# Patient Record
Sex: Male | Born: 1990 | Hispanic: No | Marital: Single | Smoking: Never smoker
Health system: Southern US, Community
[De-identification: ages and names within clinical notes are randomized; demographics above are authoritative.]

## PROBLEM LIST (undated history)

## (undated) ENCOUNTER — Ambulatory Visit: Admission: EM | Payer: Self-pay | Source: Home / Self Care

## (undated) HISTORY — PX: FOOT SURGERY: SHX648

---
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
## 2015-12-29 DIAGNOSIS — S62509A Fracture of unspecified phalanx of unspecified thumb, initial encounter for closed fracture: Secondary | ICD-10-CM | POA: Diagnosis not present

## 2015-12-29 DIAGNOSIS — M25531 Pain in right wrist: Secondary | ICD-10-CM | POA: Diagnosis not present

## 2015-12-29 DIAGNOSIS — M79641 Pain in right hand: Secondary | ICD-10-CM | POA: Diagnosis not present

## 2016-01-10 DIAGNOSIS — M79641 Pain in right hand: Secondary | ICD-10-CM | POA: Diagnosis not present

## 2016-02-13 DIAGNOSIS — M79641 Pain in right hand: Secondary | ICD-10-CM | POA: Diagnosis not present

## 2016-02-13 DIAGNOSIS — M79644 Pain in right finger(s): Secondary | ICD-10-CM | POA: Diagnosis not present

## 2016-08-09 DIAGNOSIS — J309 Allergic rhinitis, unspecified: Secondary | ICD-10-CM | POA: Diagnosis not present

## 2016-08-09 DIAGNOSIS — L739 Follicular disorder, unspecified: Secondary | ICD-10-CM | POA: Diagnosis not present

## 2017-03-29 DIAGNOSIS — S86011A Strain of right Achilles tendon, initial encounter: Secondary | ICD-10-CM | POA: Diagnosis not present

## 2017-04-02 DIAGNOSIS — S86011D Strain of right Achilles tendon, subsequent encounter: Secondary | ICD-10-CM | POA: Diagnosis not present

## 2017-04-03 DIAGNOSIS — S86011A Strain of right Achilles tendon, initial encounter: Secondary | ICD-10-CM | POA: Diagnosis not present

## 2017-04-03 DIAGNOSIS — G8918 Other acute postprocedural pain: Secondary | ICD-10-CM | POA: Diagnosis not present

## 2017-04-19 DIAGNOSIS — Z9889 Other specified postprocedural states: Secondary | ICD-10-CM | POA: Diagnosis not present

## 2017-05-02 DIAGNOSIS — Z9889 Other specified postprocedural states: Secondary | ICD-10-CM | POA: Diagnosis not present

## 2017-05-23 DIAGNOSIS — Z9889 Other specified postprocedural states: Secondary | ICD-10-CM | POA: Diagnosis not present

## 2017-06-03 DIAGNOSIS — S86011D Strain of right Achilles tendon, subsequent encounter: Secondary | ICD-10-CM | POA: Diagnosis not present

## 2017-06-05 ENCOUNTER — Emergency Department (HOSPITAL_BASED_OUTPATIENT_CLINIC_OR_DEPARTMENT_OTHER)
Admission: EM | Admit: 2017-06-05 | Discharge: 2017-06-05 | Disposition: A | Discharge status: Home / Self Care | Payer: Federal, State, Local not specified - PPO | Attending: Emergency Medicine | Admitting: Emergency Medicine

## 2017-06-05 ENCOUNTER — Encounter (HOSPITAL_BASED_OUTPATIENT_CLINIC_OR_DEPARTMENT_OTHER): Payer: Self-pay | Admitting: Emergency Medicine

## 2017-06-05 ENCOUNTER — Other Ambulatory Visit: Payer: Self-pay

## 2017-06-05 DIAGNOSIS — R002 Palpitations: Secondary | ICD-10-CM | POA: Diagnosis not present

## 2017-06-05 LAB — I-STAT CHEM 8, ED
BUN: 10 mg/dL (ref 6–20)
Calcium, Ion: 1.11 mmol/L — ABNORMAL LOW (ref 1.15–1.40)
Chloride: 102 mmol/L (ref 101–111)
Creatinine, Ser: 1 mg/dL (ref 0.61–1.24)
Glucose, Bld: 99 mg/dL (ref 65–99)
HCT: 45 % (ref 39.0–52.0)
Hemoglobin: 15.3 g/dL (ref 13.0–17.0)
Potassium: 4.4 mmol/L (ref 3.5–5.1)
Sodium: 140 mmol/L (ref 135–145)
TCO2: 27 mmol/L (ref 22–32)

## 2017-06-05 NOTE — ED Triage Notes (Addendum)
States had a episode of his heart beating fast last night and again this am, denies at present. Admits to drinking ETOH last night and smoke weed last night

## 2017-06-05 NOTE — ED Provider Notes (Signed)
[facility] EMERGENCY DEPARTMENT Provider Note  CSN: [id number] Arrival date & time: 06/05/17 4098  Chief Complaint(s) Palpitations  HPI Kirk Fitzgerald is a 27 y.o. male   The history is provided by the patient.  Palpitations   This is a recurrent problem. Episode onset: several yrs. this morning was 2 hrs ago. The problem occurs rarely. The problem has been resolved. On average, each episode lasts 5 minutes. Pertinent negatives include no fever, no malaise/fatigue, no chest pain, no exertional chest pressure, no irregular heartbeat, no PND, no abdominal pain, no nausea, no dizziness, no hemoptysis and no shortness of breath. Risk factors include obesity (EtOH use and marijuana use).   States that he noted HR of 198 with portable pulse ox at home. Resolved spontaneously within 5 minutes to HR in 80s.  Past Medical History History reviewed. No pertinent past medical history. There are no active problems to display for this patient.  Home Medication(s) Prior to Admission medications   Not on File                                                                                                                                    Past Surgical History Past Surgical History:  Procedure Laterality Date  . FOOT SURGERY Right    Family History No family history on file.  Social History Social History   Tobacco Use  . Smoking status: Not on file  Substance Use Topics  . Alcohol use: Not on file  . Drug use: Not on file   Allergies Pollen extract  Review of Systems Review of Systems  Constitutional: Negative for fever and malaise/fatigue.  Respiratory: Negative for hemoptysis and shortness of breath.   Cardiovascular: Positive for palpitations. Negative for chest pain and PND.  Gastrointestinal: Negative for abdominal pain and nausea.  Neurological: Negative for dizziness.   All other systems are reviewed and are negative for acute change except as noted in the  HPI  Physical Exam Vital Signs  I have reviewed the triage vital signs BP 117/81   Pulse 86   Temp 97.9 F (36.6 C) (Oral)   Resp 14   Ht 6\' 3"  (1.905 m)   Wt 117.9 kg (260 lb)   SpO2 99%   BMI 32.50 kg/m   Physical Exam  Constitutional: He is oriented to person, place, and time. He appears well-developed and well-nourished. No distress.  HENT:  Head: Normocephalic and atraumatic.  Nose: Nose normal.  Eyes: Conjunctivae and EOM are normal. Pupils are equal, round, and reactive to light. Right eye exhibits no discharge. Left eye exhibits no discharge. No scleral icterus.  Neck: Normal range of motion. Neck supple.  Cardiovascular: Normal rate and regular rhythm. Exam reveals no gallop and no friction rub.  No murmur heard. Pulmonary/Chest: Effort normal and breath sounds normal. No stridor. No respiratory distress. He has no rales.  Abdominal: Soft. He exhibits no distension.  There is no tenderness.  Musculoskeletal: He exhibits no edema or tenderness.  Neurological: He is alert and oriented to person, place, and time.  Skin: Skin is warm and dry. No rash noted. He is not diaphoretic. No erythema.  Psychiatric: He has a normal mood and affect.  Vitals reviewed.   ED Results and Treatments Labs (all labs ordered are listed, but only abnormal results are displayed) Labs Reviewed  I-STAT CHEM 8, ED - Abnormal; Notable for the following components:      Result Value   Calcium, Ion 1.11 (*)    All other components within normal limits                                                                                                                         EKG  EKG Interpretation  Date/Time:  Wednesday June 05 2017 07:48:52 EST Ventricular Rate:  87 PR Interval:    QRS Duration: 98 QT Interval:  371 QTC Calculation: 447 R Axis:   38 Text Interpretation:  Sinus rhythm No significant change since last tracing Confirmed by Drema Pry [phone]) on 06/05/2017 8:31:35 AM       Radiology No results found. Pertinent labs & imaging results that were available during my care of the patient were reviewed by me and considered in my medical decision making (see chart for details).  Medications Ordered in ED Medications - No data to display                                                                                                                                  Procedures Procedures  (including critical care time)  Medical Decision Making / ED Course I have reviewed the nursing notes for this encounter and the patient's prior records (if available in EHR or on provided paperwork).    Recurrent short episodes of palpitations.  Pulse ox at rate of 190s that resolved spontaneously.  EKG without acute dysrhythmias, blocks, interval changes.  No evidence of Brugada or Wolff-Parkinson-White.  Electrolytes grossly reassuring.  The patient appears reasonably screened and/or stabilized for discharge and I doubt any other medical condition or other [facility] requiring further screening, evaluation, or treatment in the ED at this time prior to discharge.  The patient is safe for discharge with strict return precautions.   Final Clinical Impression(s) / ED Diagnoses Final diagnoses:  Palpitations    Disposition: Discharge  Condition:  Good  I have discussed the results, Dx and Tx plan with the patient who expressed understanding and agree(s) with the plan. Discharge instructions discussed at great length. The patient was given strict return precautions who verbalized understanding of the instructions. No further questions at time of discharge.    ED Discharge Orders    None       Follow Up: Farris HasMorrow, Aaron, MD [address] [place] [id number] [phone]  Schedule an appointment as soon as possible for a visit  As needed     This chart was dictated using voice recognition software.  Despite best efforts to proofread,  errors can  occur which can change the documentation meaning.   Nira Connardama, Pedro Eduardo, MD 06/05/17 [phone]

## 2017-06-07 DIAGNOSIS — S86011D Strain of right Achilles tendon, subsequent encounter: Secondary | ICD-10-CM | POA: Diagnosis not present

## 2017-06-14 DIAGNOSIS — S86011D Strain of right Achilles tendon, subsequent encounter: Secondary | ICD-10-CM | POA: Diagnosis not present

## 2017-06-21 DIAGNOSIS — S86011D Strain of right Achilles tendon, subsequent encounter: Secondary | ICD-10-CM | POA: Diagnosis not present

## 2017-06-26 DIAGNOSIS — S86011D Strain of right Achilles tendon, subsequent encounter: Secondary | ICD-10-CM | POA: Diagnosis not present

## 2017-06-28 DIAGNOSIS — S86011D Strain of right Achilles tendon, subsequent encounter: Secondary | ICD-10-CM | POA: Diagnosis not present

## 2017-07-10 DIAGNOSIS — S86011D Strain of right Achilles tendon, subsequent encounter: Secondary | ICD-10-CM | POA: Diagnosis not present

## 2019-03-25 ENCOUNTER — Other Ambulatory Visit: Payer: Self-pay

## 2019-03-25 DIAGNOSIS — Z20822 Contact with and (suspected) exposure to covid-19: Secondary | ICD-10-CM

## 2019-03-27 LAB — NOVEL CORONAVIRUS, NAA: SARS-CoV-2, NAA: NOT DETECTED

## 2019-05-18 ENCOUNTER — Ambulatory Visit: Payer: Self-pay | Attending: Internal Medicine

## 2019-05-18 ENCOUNTER — Other Ambulatory Visit: Payer: Self-pay

## 2019-05-18 DIAGNOSIS — Z20822 Contact with and (suspected) exposure to covid-19: Secondary | ICD-10-CM | POA: Insufficient documentation

## 2019-05-19 LAB — NOVEL CORONAVIRUS, NAA: SARS-CoV-2, NAA: NOT DETECTED

## 2019-12-19 ENCOUNTER — Ambulatory Visit
Admission: RE | Admit: 2019-12-19 | Discharge: 2019-12-19 | Disposition: A | Discharge status: Home / Self Care | Payer: Self-pay | Source: Ambulatory Visit | Attending: Emergency Medicine | Admitting: Emergency Medicine

## 2019-12-19 ENCOUNTER — Other Ambulatory Visit: Payer: Self-pay

## 2019-12-19 VITALS — BP 132/91 | HR 72 | Temp 98.1°F | Resp 18

## 2019-12-19 DIAGNOSIS — Z20822 Contact with and (suspected) exposure to covid-19: Secondary | ICD-10-CM

## 2019-12-19 DIAGNOSIS — K529 Noninfective gastroenteritis and colitis, unspecified: Secondary | ICD-10-CM

## 2019-12-19 DIAGNOSIS — Z1152 Encounter for screening for COVID-19: Secondary | ICD-10-CM

## 2019-12-19 NOTE — ED Provider Notes (Signed)
[facility]    CSN: [id number] Arrival date & time: 12/19/19  1156      History   Chief Complaint Chief Complaint  Patient presents with  . Appointment    1200  . Emesis  . Diarrhea    HPI Kirk Fitzgerald is a 29 y.o. male presenting for Covid testing.  Endorsing nausea, vomiting, diarrhea for the last 5 days.  States frequency of diarrhea has abated the last few days.  No blood, melena, abdominal pain.  Denies nausea currently.  Denies URI symptoms such as nasal congestion, cough.  No known sick contacts.  States that he missed work the other day due to symptoms so they recommended Covid testing.    History reviewed. No pertinent past medical history.  There are no problems to display for this patient.   Past Surgical History:  Procedure Laterality Date  . FOOT SURGERY Right        Home Medications    Prior to Admission medications   Not on File    Family History History reviewed. No pertinent family history.  Social History Social History   Tobacco Use  . Smoking status: Not on file  Substance Use Topics  . Alcohol use: Not on file  . Drug use: Not on file     Allergies   Pollen extract   Review of Systems As per HPI   Physical Exam Triage Vital Signs ED Triage Vitals [12/19/19 1212]  Enc Vitals Group     BP (!) 132/91     Pulse Rate 72     Resp 18     Temp 98.1 F (36.7 C)     Temp Source Oral     SpO2 98 %     Weight      Height      Head Circumference      Peak Flow      Pain Score      Pain Loc      Pain Edu?      Excl. in GC?    No data found.  Updated Vital Signs BP (!) 132/91 (BP Location: Right Arm)   Pulse 72   Temp 98.1 F (36.7 C) (Oral)   Resp 18   SpO2 98%   Visual Acuity Right Eye Distance:   Left Eye Distance:   Bilateral Distance:    Right Eye Near:   Left Eye Near:    Bilateral Near:     Physical Exam Constitutional:      General: He is not in acute distress.    Appearance: He is  not toxic-appearing or diaphoretic.  HENT:     Head: Normocephalic and atraumatic.     Mouth/Throat:     Mouth: Mucous membranes are moist.     Pharynx: Oropharynx is clear.  Eyes:     General: No scleral icterus.    Conjunctiva/sclera: Conjunctivae normal.     Pupils: Pupils are equal, round, and reactive to light.  Neck:     Comments: Trachea midline, negative JVD Cardiovascular:     Rate and Rhythm: Normal rate and regular rhythm.  Pulmonary:     Effort: Pulmonary effort is normal. No respiratory distress.     Breath sounds: No wheezing.  Abdominal:     Palpations: Abdomen is soft.     Tenderness: There is no abdominal tenderness.  Musculoskeletal:     Cervical back: Neck supple. No tenderness.  Lymphadenopathy:     Cervical: No cervical adenopathy.  Skin:    Capillary Refill: Capillary refill takes less than 2 seconds.     Coloration: Skin is not jaundiced or pale.     Findings: No rash.  Neurological:     Mental Status: He is alert and oriented to person, place, and time.      UC Treatments / Results  Labs (all labs ordered are listed, but only abnormal results are displayed) Labs Reviewed  NOVEL CORONAVIRUS, NAA    EKG   Radiology No results found.  Procedures Procedures (including critical care time)  Medications Ordered in UC Medications - No data to display  Initial Impression / Assessment and Plan / UC Course  I have reviewed the triage vital signs and the nursing notes.  Pertinent labs & imaging results that were available during my care of the patient were reviewed by me and considered in my medical decision making (see chart for details).     Patient afebrile, nontoxic, with SpO2 98%.  Covid PCR pending.  Patient to quarantine until results are back.  We will treat supportively as outlined below.  Return precautions discussed, patient verbalized understanding and is agreeable to plan. Final Clinical Impressions(s) / UC Diagnoses   Final  diagnoses:  Encounter for screening for COVID-19  Exposure to COVID-19 virus  Gastroenteritis     Discharge Instructions     Your COVID test is pending - it is important to quarantine / isolate at home until your results are back. If you test positive and would like further evaluation for persistent or worsening symptoms, you may schedule an E-visit or virtual (video) visit throughout the [facility] app or website.  PLEASE NOTE: If you develop severe chest pain or shortness of breath please go to the ER or call 9-1-1 for further evaluation --> DO NOT schedule electronic or virtual visits for this. Please call our office for further guidance / recommendations as needed.  For information about the Covid vaccine, please visit [email or web address]    ED Prescriptions    None     PDMP not reviewed this encounter.   Hall-Potvin, [place], [place] 12/19/19 1257

## 2019-12-19 NOTE — Discharge Instructions (Addendum)
Your COVID test is pending - it is important to quarantine / isolate at home until your results are back. °If you test positive and would like further evaluation for persistent or worsening symptoms, you may schedule an E-visit or virtual (video) visit throughout the [facility] MyChart app or website. ° °PLEASE NOTE: If you develop severe chest pain or shortness of breath please go to the ER or call 9-1-1 for further evaluation --> DO NOT schedule electronic or virtual visits for this. °Please call our office for further guidance / recommendations as needed. ° °For information about the Covid vaccine, please visit [email or web address] °

## 2019-12-19 NOTE — ED Triage Notes (Signed)
Pt here for intermittent N/V/D x 5 days; pt sts was possibly exposed to covid earlier this week

## 2019-12-20 LAB — NOVEL CORONAVIRUS, NAA: SARS-CoV-2, NAA: NOT DETECTED

## 2019-12-20 LAB — SARS-COV-2, NAA 2 DAY TAT

## 2020-01-27 ENCOUNTER — Ambulatory Visit
Admission: RE | Admit: 2020-01-27 | Discharge: 2020-01-27 | Disposition: A | Discharge status: Home / Self Care | Payer: Self-pay | Source: Ambulatory Visit | Attending: Emergency Medicine | Admitting: Emergency Medicine

## 2020-01-27 ENCOUNTER — Other Ambulatory Visit: Payer: Self-pay

## 2020-01-27 VITALS — BP 138/82 | HR 63 | Temp 97.9°F | Resp 18

## 2020-01-27 DIAGNOSIS — Z1152 Encounter for screening for COVID-19: Secondary | ICD-10-CM

## 2020-01-27 DIAGNOSIS — J069 Acute upper respiratory infection, unspecified: Secondary | ICD-10-CM

## 2020-01-27 DIAGNOSIS — R197 Diarrhea, unspecified: Secondary | ICD-10-CM

## 2020-01-27 MED ORDER — BENZONATATE 200 MG PO CAPS: 200.0000 mg | ORAL_CAPSULE | Freq: Three times a day (TID) | ORAL | 0 refills | Status: AC | PRN

## 2020-01-27 NOTE — ED Provider Notes (Signed)
[facility]    CSN: [id number] Arrival date & time: 01/27/20  1507      History   Chief Complaint Chief Complaint  Patient presents with  . Appointment    1500  . Cough  . Diarrhea    HPI Kirk Fitzgerald is a 29 y.o. male presenting today for evaluation of cough and diarrhea.  Patient reports over the past 3 days has had cough and diarrhea.  Has had some mild associate abdominal cramping with diarrhea.  Diarrhea has eased off today.  Denies associated sore throat.  Has had congestion, but attributes this to his regular allergies.  Denies fevers.  Denies known Covid exposure.  HPI  History reviewed. No pertinent past medical history.  There are no problems to display for this patient.   Past Surgical History:  Procedure Laterality Date  . FOOT SURGERY Right        Home Medications    Prior to Admission medications   Medication Sig Start Date End Date Taking? Authorizing Provider  benzonatate (TESSALON) 200 MG capsule Take 1 capsule (200 mg total) by mouth 3 (three) times daily as needed for up to 7 days for cough. 01/27/20 02/03/20  Saahir Prude, Junius Creamer, PA-C    Family History Family History  Family history unknown: Yes    Social History Social History   Tobacco Use  . Smoking status: Never Smoker  . Smokeless tobacco: Never Used  Substance Use Topics  . Alcohol use: Not on file  . Drug use: Not on file     Allergies   Pollen extract   Review of Systems Review of Systems  Constitutional: Negative for activity change, appetite change, chills, fatigue and fever.  HENT: Positive for congestion and rhinorrhea. Negative for ear pain, sinus pressure, sore throat and trouble swallowing.   Eyes: Negative for discharge and redness.  Respiratory: Positive for cough. Negative for chest tightness and shortness of breath.   Cardiovascular: Negative for chest pain.  Gastrointestinal: Positive for abdominal pain and diarrhea. Negative for nausea and  vomiting.  Musculoskeletal: Negative for myalgias.  Skin: Negative for rash.  Neurological: Negative for dizziness, light-headedness and headaches.     Physical Exam Triage Vital Signs ED Triage Vitals  Enc Vitals Group     BP 01/27/20 1543 138/82     Pulse Rate 01/27/20 1543 63     Resp 01/27/20 1543 18     Temp 01/27/20 1543 97.9 F (36.6 C)     Temp Source 01/27/20 1543 Oral     SpO2 01/27/20 1543 98 %     Weight --      Height --      Head Circumference --      Peak Flow --      Pain Score 01/27/20 1544 4     Pain Loc --      Pain Edu? --      Excl. in GC? --    No data found.  Updated Vital Signs BP 138/82 (BP Location: Right Arm)   Pulse 63   Temp 97.9 F (36.6 C) (Oral)   Resp 18   SpO2 98%   Visual Acuity Right Eye Distance:   Left Eye Distance:   Bilateral Distance:    Right Eye Near:   Left Eye Near:    Bilateral Near:     Physical Exam Vitals and nursing note reviewed.  Constitutional:      Appearance: He is well-developed.     Comments:  No acute distress  HENT:     Head: Normocephalic and atraumatic.     Ears:     Comments: Bilateral ears without tenderness to palpation of external auricle, tragus and mastoid, EAC's without erythema or swelling, TM's with good bony landmarks and cone of light. Non erythematous.     Nose: Nose normal.     Mouth/Throat:     Comments: Oral mucosa pink and moist, no tonsillar enlargement or exudate. Posterior pharynx patent and nonerythematous, no uvula deviation or swelling. Normal phonation. Eyes:     Conjunctiva/sclera: Conjunctivae normal.  Cardiovascular:     Rate and Rhythm: Normal rate.  Pulmonary:     Effort: Pulmonary effort is normal. No respiratory distress.     Comments: Breathing comfortably at rest, CTABL, no wheezing, rales or other adventitious sounds auscultated Abdominal:     General: There is no distension.  Musculoskeletal:        General: Normal range of motion.     Cervical back: Neck  supple.  Skin:    General: Skin is warm and dry.  Neurological:     Mental Status: He is alert and oriented to person, place, and time.      UC Treatments / Results  Labs (all labs ordered are listed, but only abnormal results are displayed) Labs Reviewed  NOVEL CORONAVIRUS, NAA    EKG   Radiology No results found.  Procedures Procedures (including critical care time)  Medications Ordered in UC Medications - No data to display  Initial Impression / Assessment and Plan / UC Course  I have reviewed the triage vital signs and the nursing notes.  Pertinent labs & imaging results that were available during my care of the patient were reviewed by me and considered in my medical decision making (see chart for details).     Covid test pending.  3 days of URI symptoms and diarrhea, suspect likely viral etiology.  Exam reassuring.  Recommending symptomatic and supportive care with close monitoring.  Discussed strict return precautions. Patient verbalized understanding and is agreeable with plan.  Final Clinical Impressions(s) / UC Diagnoses   Final diagnoses:  Encounter for screening for COVID-19  Viral URI with cough  Diarrhea, unspecified type     Discharge Instructions     COVID test pending Rest and fluids Tylenol and ibuprofen as needed  Tessalon for cough Follow up if not improving or worsneing    ED Prescriptions    Medication Sig Dispense Auth. Provider   benzonatate (TESSALON) 200 MG capsule Take 1 capsule (200 mg total) by mouth 3 (three) times daily as needed for up to 7 days for cough. 28 capsule Donyale Falcon, [place] C, PA-C     PDMP not reviewed this encounter.   Lew Dawes, PA-C 01/27/20 1710

## 2020-01-27 NOTE — ED Triage Notes (Signed)
Pt here for cough and diarrhea starting on Monday

## 2020-01-27 NOTE — Discharge Instructions (Signed)
COVID test pending Rest and fluids Tylenol and ibuprofen as needed  Tessalon for cough Follow up if not improving or worsneing

## 2020-01-28 LAB — NOVEL CORONAVIRUS, NAA: SARS-CoV-2, NAA: NOT DETECTED

## 2020-01-28 LAB — SARS-COV-2, NAA 2 DAY TAT

## 2020-03-01 ENCOUNTER — Other Ambulatory Visit: Payer: Self-pay

## 2020-03-01 ENCOUNTER — Ambulatory Visit
Admission: RE | Admit: 2020-03-01 | Discharge: 2020-03-01 | Disposition: A | Discharge status: Home / Self Care | Payer: Self-pay | Source: Ambulatory Visit | Attending: Family Medicine | Admitting: Family Medicine

## 2020-03-01 VITALS — BP 123/81 | HR 65 | Temp 98.1°F | Resp 18

## 2020-03-01 DIAGNOSIS — J069 Acute upper respiratory infection, unspecified: Secondary | ICD-10-CM

## 2020-03-01 DIAGNOSIS — Z1152 Encounter for screening for COVID-19: Secondary | ICD-10-CM

## 2020-03-01 MED ORDER — BENZONATATE 100 MG PO CAPS: 100.0000 mg | ORAL_CAPSULE | Freq: Three times a day (TID) | ORAL | 0 refills | Status: DC

## 2020-03-01 NOTE — ED Provider Notes (Signed)
[facility]    CSN: [id number] Arrival date & time: 03/01/20  1251      History   Chief Complaint Chief Complaint  Patient presents with  . Sore Throat  . Shortness of Breath  . Cough    HPI Kirk Fitzgerald is a 29 y.o. male.  Patient presents with nonproductive cough and some sore throat.  No fever.  Denies any sinus congestion postnasal drainage.  There is some soreness laterally in his chest probably related to the coughing.  HPI  History reviewed. No pertinent past medical history.  There are no problems to display for this patient.   Past Surgical History:  Procedure Laterality Date  . FOOT SURGERY Right        Home Medications    Prior to Admission medications   Not on File    Family History Family History  Family history unknown: Yes    Social History Social History   Tobacco Use  . Smoking status: Never Smoker  . Smokeless tobacco: Never Used  Substance Use Topics  . Alcohol use: Not on file  . Drug use: Not on file     Allergies   Pollen extract   Review of Systems Review of Systems  Constitutional: Negative for fever.  HENT: Positive for congestion.   Respiratory: Positive for cough.   Cardiovascular: Positive for chest pain.  All other systems reviewed and are negative.    Physical Exam Triage Vital Signs ED Triage Vitals  Enc Vitals Group     BP 03/01/20 1305 123/81     Pulse Rate 03/01/20 1305 65     Resp 03/01/20 1305 18     Temp 03/01/20 1305 98.1 F (36.7 C)     Temp Source 03/01/20 1305 Oral     SpO2 03/01/20 1305 97 %     Weight --      Height --      Head Circumference --      Peak Flow --      Pain Score 03/01/20 1306 4     Pain Loc --      Pain Edu? --      Excl. in GC? --    No data found.  Updated Vital Signs BP 123/81 (BP Location: Left Arm)   Pulse 65   Temp 98.1 F (36.7 C) (Oral)   Resp 18   SpO2 97%   Visual Acuity Right Eye Distance:   Left Eye Distance:   Bilateral  Distance:    Right Eye Near:   Left Eye Near:    Bilateral Near:     Physical Exam Vitals and nursing note reviewed.  Constitutional:      Appearance: He is well-developed.  HENT:     Right Ear: Tympanic membrane normal.     Left Ear: Tympanic membrane normal.     Mouth/Throat:     Mouth: Mucous membranes are moist.  Cardiovascular:     Rate and Rhythm: Normal rate and regular rhythm.  Pulmonary:     Effort: Pulmonary effort is normal. No respiratory distress.     Breath sounds: Normal breath sounds. No wheezing, rhonchi or rales.  Chest:     Chest wall: No tenderness.  Neurological:     General: No focal deficit present.     Mental Status: He is alert and oriented to person, place, and time.      UC Treatments / Results  Labs (all labs ordered are listed, but only abnormal  results are displayed) Labs Reviewed  NOVEL CORONAVIRUS, NAA    EKG   Radiology No results found.  Procedures Procedures (including critical care time)  Medications Ordered in UC Medications - No data to display  Initial Impression / Assessment and Plan / UC Course  I have reviewed the triage vital signs and the nursing notes.  Pertinent labs & imaging results that were available during my care of the patient were reviewed by me and considered in my medical decision making (see chart for details).     Upper respiratory infection with cough Final Clinical Impressions(s) / UC Diagnoses   Final diagnoses:  Encounter for screening for COVID-19   Discharge Instructions   None    ED Prescriptions    None     PDMP not reviewed this encounter.   Frederica Kuster, MD 03/01/20 1344

## 2020-03-01 NOTE — ED Triage Notes (Signed)
Pt present sore throat with coughing and sob. Symptoms started two or three days ago.

## 2020-03-02 LAB — SARS-COV-2, NAA 2 DAY TAT

## 2020-03-02 LAB — NOVEL CORONAVIRUS, NAA: SARS-CoV-2, NAA: NOT DETECTED

## 2020-03-21 ENCOUNTER — Ambulatory Visit
Admission: RE | Admit: 2020-03-21 | Discharge: 2020-03-21 | Disposition: A | Discharge status: Home / Self Care | Payer: Self-pay | Source: Ambulatory Visit | Attending: Family Medicine | Admitting: Family Medicine

## 2020-03-21 ENCOUNTER — Ambulatory Visit: Payer: Self-pay

## 2020-03-21 ENCOUNTER — Other Ambulatory Visit: Payer: Self-pay

## 2020-03-21 VITALS — BP 110/64 | HR 74 | Temp 99.3°F | Resp 20

## 2020-03-21 DIAGNOSIS — Z20822 Contact with and (suspected) exposure to covid-19: Secondary | ICD-10-CM

## 2020-03-21 DIAGNOSIS — R059 Cough, unspecified: Secondary | ICD-10-CM

## 2020-03-21 DIAGNOSIS — H65111 Acute and subacute allergic otitis media (mucoid) (sanguinous) (serous), right ear: Secondary | ICD-10-CM

## 2020-03-21 MED ORDER — AZITHROMYCIN 250 MG PO TABS: ORAL_TABLET | ORAL | 0 refills | Status: DC

## 2020-03-21 MED ORDER — BENZONATATE 100 MG PO CAPS: 100.0000 mg | ORAL_CAPSULE | Freq: Three times a day (TID) | ORAL | 0 refills | Status: DC

## 2020-03-21 NOTE — ED Provider Notes (Signed)
[facility]    CSN: [id number] Arrival date & time: 03/21/20  1356      History   Chief Complaint Chief Complaint  Patient presents with  . Cough  . Nasal Congestion    HPI Kirk Fitzgerald is a 29 y.o. male.   This is an established [facility] patient who was just seen 20 days ago for Covid testing which was negative.  He is complaining of cough and nasal congestion.  Symptoms began 1 day after playing basketball.  He developed right ear pain and cough.  The problems he was having 2 weeks ago had resolved.     History reviewed. No pertinent past medical history.  There are no problems to display for this patient.   Past Surgical History:  Procedure Laterality Date  . FOOT SURGERY Right        Home Medications    Prior to Admission medications   Medication Sig Start Date End Date Taking? Authorizing Provider  azithromycin (ZITHROMAX) 250 MG tablet Take 2 tabs PO x 1 dose, then 1 tab PO QD x 4 days 03/21/20   Elvina Sidle, MD  benzonatate (TESSALON) 100 MG capsule Take 1 capsule (100 mg total) by mouth every 8 (eight) hours. 03/21/20   Elvina Sidle, MD    Family History Family History  Family history unknown: Yes    Social History Social History   Tobacco Use  . Smoking status: Never Smoker  . Smokeless tobacco: Never Used  Substance Use Topics  . Alcohol use: Not on file  . Drug use: Not on file     Allergies   Pollen extract   Review of Systems Review of Systems  Constitutional: Positive for fatigue.  HENT: Positive for ear pain.   Respiratory: Positive for cough.   Musculoskeletal: Positive for myalgias.     Physical Exam Triage Vital Signs ED Triage Vitals [03/21/20 1404]  Enc Vitals Group     BP 110/64     Pulse Rate 74     Resp 20     Temp 99.3 F (37.4 C)     Temp Source Oral     SpO2 95 %     Weight      Height      Head Circumference      Peak Flow      Pain Score      Pain Loc       Pain Edu?      Excl. in GC?    No data found.  Updated Vital Signs BP 110/64 (BP Location: Left Arm)   Pulse 74   Temp 99.3 F (37.4 C) (Oral)   Resp 20   SpO2 95%    Physical Exam Vitals and nursing note reviewed.  Constitutional:      General: He is not in acute distress.    Appearance: Normal appearance. He is ill-appearing.  HENT:     Head: Normocephalic.     Ears:     Comments: Right ear is opaque and retracted    Nose: Congestion present.     Mouth/Throat:     Pharynx: Oropharynx is clear.  Eyes:     Conjunctiva/sclera: Conjunctivae normal.  Cardiovascular:     Rate and Rhythm: Normal rate.     Heart sounds: Normal heart sounds.  Pulmonary:     Effort: Pulmonary effort is normal.     Breath sounds: Normal breath sounds.  Musculoskeletal:  General: Normal range of motion.     Cervical back: Normal range of motion and neck supple.  Skin:    General: Skin is warm and dry.  Neurological:     General: No focal deficit present.     Mental Status: He is alert.  Psychiatric:        Mood and Affect: Mood normal.        Behavior: Behavior normal.        Thought Content: Thought content normal.      UC Treatments / Results  Labs (all labs ordered are listed, but only abnormal results are displayed) Labs Reviewed  COVID-19, FLU A+B AND RSV    EKG   Radiology No results found.  Procedures Procedures (including critical care time)  Medications Ordered in UC Medications - No data to display  Initial Impression / Assessment and Plan / UC Course  I have reviewed the triage vital signs and the nursing notes.  Pertinent labs & imaging results that were available during my care of the patient were reviewed by me and considered in my medical decision making (see chart for details).    Final Clinical Impressions(s) / UC Diagnoses   Final diagnoses:  Encounter for screening laboratory testing for COVID-19 virus  Acute mucoid otitis media of right  ear  Cough   Discharge Instructions   None    ED Prescriptions    Medication Sig Dispense Auth. Provider   benzonatate (TESSALON) 100 MG capsule Take 1 capsule (100 mg total) by mouth every 8 (eight) hours. 21 capsule Elvina Sidle, MD   azithromycin (ZITHROMAX) 250 MG tablet Take 2 tabs PO x 1 dose, then 1 tab PO QD x 4 days 6 tablet Elvina Sidle, MD     I have reviewed the PDMP during this encounter.   Elvina Sidle, MD 03/21/20 1426

## 2020-03-21 NOTE — ED Triage Notes (Signed)
Pt here for cough and sore throat with nasal congestion x 3 days

## 2020-03-23 LAB — COVID-19, FLU A+B AND RSV
Influenza A, NAA: NOT DETECTED
Influenza B, NAA: NOT DETECTED
RSV, NAA: NOT DETECTED
SARS-CoV-2, NAA: DETECTED — AB

## 2020-05-06 ENCOUNTER — Ambulatory Visit: Payer: Self-pay

## 2020-05-14 ENCOUNTER — Ambulatory Visit: Payer: Self-pay

## 2020-05-15 ENCOUNTER — Other Ambulatory Visit: Payer: Self-pay

## 2020-05-15 ENCOUNTER — Ambulatory Visit
Admission: RE | Admit: 2020-05-15 | Discharge: 2020-05-15 | Disposition: A | Discharge status: Home / Self Care | Payer: Federal, State, Local not specified - PPO | Source: Ambulatory Visit | Attending: Urgent Care | Admitting: Urgent Care

## 2020-05-15 VITALS — BP 131/82 | HR 72 | Temp 97.9°F | Resp 18

## 2020-05-15 DIAGNOSIS — Z20822 Contact with and (suspected) exposure to covid-19: Secondary | ICD-10-CM

## 2020-05-15 DIAGNOSIS — B349 Viral infection, unspecified: Secondary | ICD-10-CM | POA: Diagnosis not present

## 2020-05-15 MED ORDER — BENZONATATE 100 MG PO CAPS: 100.0000 mg | ORAL_CAPSULE | Freq: Three times a day (TID) | ORAL | 0 refills | Status: DC | PRN

## 2020-05-15 MED ORDER — CETIRIZINE HCL 10 MG PO TABS: 10.0000 mg | ORAL_TABLET | Freq: Every day | ORAL | 0 refills | Status: DC

## 2020-05-15 MED ORDER — PROMETHAZINE-DM 6.25-15 MG/5ML PO SYRP: 5.0000 mL | ORAL_SOLUTION | Freq: Every evening | ORAL | 0 refills | Status: DC | PRN

## 2020-05-15 MED ORDER — PSEUDOEPHEDRINE HCL 60 MG PO TABS: 60.0000 mg | ORAL_TABLET | Freq: Three times a day (TID) | ORAL | 0 refills | Status: DC | PRN

## 2020-05-15 MED ORDER — ONDANSETRON 8 MG PO TBDP: 8.0000 mg | ORAL_TABLET | Freq: Three times a day (TID) | ORAL | 0 refills | Status: DC | PRN

## 2020-05-15 NOTE — ED Triage Notes (Signed)
Pt here for body aches, vomiting x 1 and possible covid exposure; pt sts started feeling bad 4 days ago

## 2020-05-15 NOTE — Discharge Instructions (Addendum)
We will notify you of your COVID-19 test results as they arrive and may take between 24 to 48 hours.  I encourage you to sign up for MyChart if you have not already done so as this can be the easiest way for [place] to communicate results to you online or through a phone app.  In the meantime, if you develop worsening symptoms including fever, chest pain, shortness of breath despite our current treatment plan then please report to the emergency room as this may be a sign of worsening status from possible COVID-19 infection.  Otherwise, we will manage this as a viral syndrome. For sore throat or cough try using a honey-based tea. Use 3 teaspoons of honey with juice squeezed from half lemon. Place shaved pieces of ginger into 1/2-1 cup of water and warm over stove top. Then mix the ingredients and repeat every 4 hours as needed. Please take Tylenol 500mg -650mg  every 6 hours for aches and pains, fevers. Hydrate very well with at least 2 liters of water. Eat light meals such as soups to replenish electrolytes and soft fruits, veggies. Start an antihistamine like Zyrtec, Allegra or Claritin for postnasal drainage, sinus congestion.  You can take this together with pseudoephedrine (Sudafed) at a dose of 60 mg 2-3 times a day as needed for the same kind of congestion.  You can use Zofran for nausea, vomiting, diarrhea.

## 2020-05-15 NOTE — ED Provider Notes (Signed)
  [facility]   MRN: [id number] DOB: 06/10/1990  Subjective:   GEROGE Fitzgerald is a 30 y.o. male presenting for 4-day history of acute onset malaise, fatigue, body aches, 1 episode of vomiting. Had mild shob yesterday but has improved.  Denies history of lung disorders.  No smoking.  Patient is vaccinated against COVID-19.  Denies taking chronic medications.    Allergies  Allergen Reactions  . Pollen Extract     History reviewed. No pertinent past medical history.   Past Surgical History:  Procedure Laterality Date  . FOOT SURGERY Right     Family History  Family history unknown: Yes    Social History   Tobacco Use  . Smoking status: Never Smoker  . Smokeless tobacco: Never Used    ROS   Objective:   Vitals: BP 131/82 (BP Location: Left Arm)   Pulse 72   Temp 97.9 F (36.6 C) (Oral)   Resp 18   SpO2 98%   Physical Exam Constitutional:      General: He is not in acute distress.    Appearance: Normal appearance. He is well-developed. He is not ill-appearing, toxic-appearing or diaphoretic.  HENT:     Head: Normocephalic and atraumatic.     Right Ear: External ear normal.     Left Ear: External ear normal.     Nose: Nose normal.     Mouth/Throat:     Mouth: Mucous membranes are moist.     Pharynx: Oropharynx is clear.  Eyes:     General: No scleral icterus.    Extraocular Movements: Extraocular movements intact.     Pupils: Pupils are equal, round, and reactive to light.  Cardiovascular:     Rate and Rhythm: Normal rate and regular rhythm.     Heart sounds: Normal heart sounds. No murmur heard. No friction rub. No gallop.   Pulmonary:     Effort: Pulmonary effort is normal. No respiratory distress.     Breath sounds: Normal breath sounds. No stridor. No wheezing, rhonchi or rales.  Neurological:     Mental Status: He is alert and oriented to person, place, and time.  Psychiatric:        Mood and Affect: Mood normal.        Behavior:  Behavior normal.        Thought Content: Thought content normal.     Assessment and Plan :   PDMP not reviewed this encounter.  1. Viral syndrome   2. Encounter for screening laboratory testing for COVID-19 virus     Will manage for viral illness such as viral URI, viral syndrome, viral rhinitis, COVID-19. Counseled patient on nature of COVID-19 including modes of transmission, diagnostic testing, management and supportive care.  Offered scripts for symptomatic relief. COVID 19 testing is pending. Counseled patient on potential for adverse effects with medications prescribed/recommended today, ER and return-to-clinic precautions discussed, patient verbalized understanding.     Wallis Bamberg, [place] 05/15/20 1424

## 2020-05-16 LAB — SARS-COV-2, NAA 2 DAY TAT

## 2020-05-16 LAB — NOVEL CORONAVIRUS, NAA: SARS-CoV-2, NAA: NOT DETECTED

## 2020-06-03 ENCOUNTER — Ambulatory Visit (INDEPENDENT_AMBULATORY_CARE_PROVIDER_SITE_OTHER): Payer: Federal, State, Local not specified - PPO

## 2020-06-03 ENCOUNTER — Ambulatory Visit: Payer: Self-pay

## 2020-06-03 ENCOUNTER — Other Ambulatory Visit: Payer: Self-pay

## 2020-06-03 ENCOUNTER — Ambulatory Visit
Admission: EM | Admit: 2020-06-03 | Discharge: 2020-06-03 | Disposition: A | Discharge status: Home / Self Care | Payer: Federal, State, Local not specified - PPO | Attending: Internal Medicine | Admitting: Internal Medicine

## 2020-06-03 DIAGNOSIS — S63630A Sprain of interphalangeal joint of right index finger, initial encounter: Secondary | ICD-10-CM

## 2020-06-03 DIAGNOSIS — M79641 Pain in right hand: Secondary | ICD-10-CM

## 2020-06-03 DIAGNOSIS — M7989 Other specified soft tissue disorders: Secondary | ICD-10-CM | POA: Diagnosis not present

## 2020-06-03 MED ORDER — IBUPROFEN 600 MG PO TABS: 600.0000 mg | ORAL_TABLET | Freq: Four times a day (QID) | ORAL | 0 refills | Status: DC | PRN

## 2020-06-03 NOTE — Discharge Instructions (Addendum)
Please keep the buddy tape for the next 12 to 24 hours Take the buddy tape off twice a day and do gentle range of motion exercises Take medications as directed If swelling worsens please return to urgent care to be reevaluated You can stop buddy taping your fingers after 48 hours if the swelling is improved.

## 2020-06-06 NOTE — ED Provider Notes (Signed)
[facility]    CSN: [id number] Arrival date & time: 06/03/20  1715      History   Chief Complaint Chief Complaint  Patient presents with  . Hand Injury    Right occurred wednesday    HPI Kirk Fitzgerald is a 30 y.o. male comes to the urgent care with right index finger pain which started yesterday.  Patient was playing basketball when he jammed his finger into the basketball was tackling another player.  Pain is of moderate severity-currently 5 out of 10.  Aggravated by trying to make a fist.  No known relieving factors.  No erythema.  Patient endorses swelling of the right index finger.  He is otherwise able to move the rest of the fingers on the right without significant pain.  No radiation of the pain.Marland Kitchen   HPI  History reviewed. No pertinent past medical history.  There are no problems to display for this patient.   Past Surgical History:  Procedure Laterality Date  . FOOT SURGERY Right        Home Medications    Prior to Admission medications   Medication Sig Start Date End Date Taking? Authorizing Provider  benzonatate (TESSALON) 100 MG capsule Take 1-2 capsules (100-200 mg total) by mouth 3 (three) times daily as needed. 05/15/20  Yes Wallis Bamberg, PA-C  cetirizine (ZYRTEC ALLERGY) 10 MG tablet Take 1 tablet (10 mg total) by mouth daily. 05/15/20  Yes Wallis Bamberg, PA-C  ibuprofen (ADVIL) 600 MG tablet Take 1 tablet (600 mg total) by mouth every 6 (six) hours as needed. 06/03/20  Yes Lamptey, Britta Mccreedy, MD  ondansetron (ZOFRAN-ODT) 8 MG disintegrating tablet Take 1 tablet (8 mg total) by mouth every 8 (eight) hours as needed for nausea or vomiting. 05/15/20  Yes Wallis Bamberg, PA-C  promethazine-dextromethorphan (PROMETHAZINE-DM) 6.25-15 MG/5ML syrup Take 5 mLs by mouth at bedtime as needed for cough. 05/15/20  Yes Wallis Bamberg, PA-C  pseudoephedrine (SUDAFED) 60 MG tablet Take 1 tablet (60 mg total) by mouth every 8 (eight) hours as needed for congestion. 05/15/20   Yes Wallis Bamberg, PA-C    Family History Family History  Family history unknown: Yes    Social History Social History   Tobacco Use  . Smoking status: Never Smoker  . Smokeless tobacco: Never Used  Vaping Use  . Vaping Use: Never used  Substance Use Topics  . Alcohol use: Yes  . Drug use: Never     Allergies   Pollen extract   Review of Systems Review of Systems  Respiratory: Negative.   Gastrointestinal: Negative.   Genitourinary: Negative.   Musculoskeletal: Positive for arthralgias and joint swelling. Negative for gait problem.  Skin: Positive for wound. Negative for color change and rash.     Physical Exam Triage Vital Signs ED Triage Vitals  Enc Vitals Group     BP 06/03/20 1828 122/81     Pulse Rate 06/03/20 1828 73     Resp 06/03/20 1828 17     Temp 06/03/20 1828 98.2 F (36.8 C)     Temp Source 06/03/20 1828 Oral     SpO2 06/03/20 1828 99 %     Weight --      Height --      Head Circumference --      Peak Flow --      Pain Score 06/03/20 1750 5     Pain Loc --      Pain Edu? --  Excl. in GC? --    No data found.  Updated Vital Signs BP 122/81 (BP Location: Left Arm)   Pulse 73   Temp 98.2 F (36.8 C) (Oral)   Resp 17   SpO2 99%   Visual Acuity Right Eye Distance:   Left Eye Distance:   Bilateral Distance:    Right Eye Near:   Left Eye Near:    Bilateral Near:     Physical Exam Vitals and nursing note reviewed.  Constitutional:      Appearance: Normal appearance.  Musculoskeletal:     Comments: Tender swelling of the right index finger with limited range of motion.  Second through fifth fingers of the right hand is without any pain.  Patient is able to move all fingers except the right INDEX finger.  No erythema.  Bruising noted.  Neurological:     Mental Status: He is alert.      UC Treatments / Results  Labs (all labs ordered are listed, but only abnormal results are displayed) Labs Reviewed - No data to  display  EKG   Radiology No results found.  Procedures Procedures (including critical care time)  Medications Ordered in UC Medications - No data to display  Initial Impression / Assessment and Plan / UC Course  I have reviewed the triage vital signs and the nursing notes.  Pertinent labs & imaging results that were available during my care of the patient were reviewed by me and considered in my medical decision making (see chart for details).     1.  Sprain of the right index finger: Buddy tape of the right index and middle fingers was recommended Ibuprofen as needed for pain Gentle range of motion exercises Return to urgent care if symptoms persist. Final Clinical Impressions(s) / UC Diagnoses   Final diagnoses:  Sprain of interphalangeal joint of right index finger, initial encounter     Discharge Instructions     Please keep the buddy tape for the next 12 to 24 hours Take the buddy tape off twice a day and do gentle range of motion exercises Take medications as directed If swelling worsens please return to urgent care to be reevaluated You can stop buddy taping your fingers after 48 hours if the swelling is improved.    ED Prescriptions    Medication Sig Dispense Auth. Provider   ibuprofen (ADVIL) 600 MG tablet Take 1 tablet (600 mg total) by mouth every 6 (six) hours as needed. 30 tablet Lamptey, Britta Mccreedy, MD     PDMP not reviewed this encounter.   Merrilee Jansky, MD 06/06/20 [phone]

## 2020-06-09 DIAGNOSIS — M79644 Pain in right finger(s): Secondary | ICD-10-CM | POA: Diagnosis not present

## 2020-06-09 DIAGNOSIS — S63614A Unspecified sprain of right ring finger, initial encounter: Secondary | ICD-10-CM | POA: Diagnosis not present

## 2020-06-09 DIAGNOSIS — M79641 Pain in right hand: Secondary | ICD-10-CM | POA: Diagnosis not present

## 2020-06-16 DIAGNOSIS — S63619A Unspecified sprain of unspecified finger, initial encounter: Secondary | ICD-10-CM | POA: Diagnosis not present

## 2020-07-02 ENCOUNTER — Encounter (HOSPITAL_COMMUNITY): Payer: Self-pay | Admitting: Radiology

## 2020-08-22 ENCOUNTER — Encounter: Payer: Self-pay | Admitting: Emergency Medicine

## 2020-08-22 ENCOUNTER — Ambulatory Visit: Payer: Self-pay

## 2020-08-22 ENCOUNTER — Ambulatory Visit
Admission: EM | Admit: 2020-08-22 | Discharge: 2020-08-22 | Disposition: A | Discharge status: Home / Self Care | Payer: Federal, State, Local not specified - PPO | Attending: Emergency Medicine | Admitting: Emergency Medicine

## 2020-08-22 DIAGNOSIS — J019 Acute sinusitis, unspecified: Secondary | ICD-10-CM

## 2020-08-22 MED ORDER — BENZONATATE 200 MG PO CAPS: 200.0000 mg | ORAL_CAPSULE | Freq: Three times a day (TID) | ORAL | 0 refills | Status: AC | PRN

## 2020-08-22 MED ORDER — CETIRIZINE HCL 10 MG PO CAPS: 10.0000 mg | ORAL_CAPSULE | Freq: Every day | ORAL | 0 refills | Status: DC

## 2020-08-22 MED ORDER — FLUTICASONE PROPIONATE 50 MCG/ACT NA SUSP: 1.0000 | Freq: Every day | NASAL | 0 refills | Status: DC

## 2020-08-22 MED ORDER — AMOXICILLIN-POT CLAVULANATE 875-125 MG PO TABS: 1.0000 | ORAL_TABLET | Freq: Two times a day (BID) | ORAL | 0 refills | Status: AC

## 2020-08-22 NOTE — ED Triage Notes (Signed)
Patient c/o productive cough, congestion, runny nose, no ear pain.  Denies fever.  Patient has been taking Tylenol Cold and Flu.  Patient is vaccinated.

## 2020-08-22 NOTE — ED Provider Notes (Signed)
[facility]    CSN: [id number] Arrival date & time: 08/22/20  1519      History   Chief Complaint Chief Complaint  Patient presents with  . Cough    HPI Kirk Fitzgerald is a 30 y.o. male presenting today for evaluation of URI symptoms.  Reports associated cough congestion rhinorrhea.  Denies fevers.  Using Tylenol Cold and flu.  Symptoms have been going on for over 1 week.  Reports initially taking at home COVID test which was positive, 2 days later took PCR at [facility] which was negative.  Patient reports continued symptoms without any improvement over the past 10 days.  Symptoms mainly in sinuses and throat.  Denies chest pain or shortness of breath.  HPI  History reviewed. No pertinent past medical history.  There are no problems to display for this patient.   Past Surgical History:  Procedure Laterality Date  . FOOT SURGERY Right        Home Medications    Prior to Admission medications   Medication Sig Start Date End Date Taking? Authorizing Provider  amoxicillin-clavulanate (AUGMENTIN) 875-125 MG tablet Take 1 tablet by mouth every 12 (twelve) hours for 7 days. 08/22/20 08/29/20 Yes Shari Natt C, PA-C  benzonatate (TESSALON) 200 MG capsule Take 1 capsule (200 mg total) by mouth 3 (three) times daily as needed for up to 7 days for cough. 08/22/20 08/29/20 Yes Febe Champa C, PA-C  Cetirizine HCl 10 MG CAPS Take 1 capsule (10 mg total) by mouth daily for 10 days. 08/22/20 09/01/20 Yes Needham Biggins C, PA-C  fluticasone (FLONASE) 50 MCG/ACT nasal spray Place 1-2 sprays into both nostrils daily. 08/22/20  Yes Eilis Chestnutt, Junius Creamer, PA-C    Family History Family History  Family history unknown: Yes    Social History Social History   Tobacco Use  . Smoking status: Never Smoker  . Smokeless tobacco: Never Used  Vaping Use  . Vaping Use: Never used  Substance Use Topics  . Alcohol use: Yes  . Drug use: Never     Allergies   Pollen extract   Review of  Systems Review of Systems  Constitutional: Negative for activity change, appetite change, chills, fatigue and fever.  HENT: Positive for congestion, rhinorrhea and sinus pressure. Negative for ear pain, sore throat and trouble swallowing.   Eyes: Negative for discharge and redness.  Respiratory: Positive for cough. Negative for chest tightness and shortness of breath.   Cardiovascular: Negative for chest pain.  Gastrointestinal: Negative for abdominal pain, diarrhea, nausea and vomiting.  Musculoskeletal: Negative for myalgias.  Skin: Negative for rash.  Neurological: Negative for dizziness, light-headedness and headaches.     Physical Exam Triage Vital Signs ED Triage Vitals  Enc Vitals Group     BP 08/22/20 1552 131/85     Pulse Rate 08/22/20 1552 68     Resp --      Temp 08/22/20 1552 98.1 F (36.7 C)     Temp Source 08/22/20 1552 Oral     SpO2 08/22/20 1552 98 %     Weight --      Height --      Head Circumference --      Peak Flow --      Pain Score 08/22/20 1553 3     Pain Loc --      Pain Edu? --      Excl. in GC? --    No data found.  Updated Vital Signs BP 131/85 (BP  Location: Left Arm)   Pulse 68   Temp 98.1 F (36.7 C) (Oral)   SpO2 98%   Visual Acuity Right Eye Distance:   Left Eye Distance:   Bilateral Distance:    Right Eye Near:   Left Eye Near:    Bilateral Near:     Physical Exam Vitals and nursing note reviewed.  Constitutional:      Appearance: He is well-developed.     Comments: No acute distress  HENT:     Head: Normocephalic and atraumatic.     Ears:     Comments: Bilateral ears without tenderness to palpation of external auricle, tragus and mastoid, EAC's without erythema or swelling, TM's with good bony landmarks and cone of light. Non erythematous.     Nose: Nose normal.     Mouth/Throat:     Comments: Oral mucosa pink and moist, no tonsillar enlargement or exudate. Posterior pharynx patent and nonerythematous, no uvula  deviation or swelling. Normal phonation. Eyes:     Conjunctiva/sclera: Conjunctivae normal.  Cardiovascular:     Rate and Rhythm: Normal rate and regular rhythm.  Pulmonary:     Effort: Pulmonary effort is normal. No respiratory distress.     Comments: Breathing comfortably at rest, CTABL, no wheezing, rales or other adventitious sounds auscultated Abdominal:     General: There is no distension.  Musculoskeletal:        General: Normal range of motion.     Cervical back: Neck supple.  Skin:    General: Skin is warm and dry.  Neurological:     Mental Status: He is alert and oriented to person, place, and time.      UC Treatments / Results  Labs (all labs ordered are listed, but only abnormal results are displayed) Labs Reviewed - No data to display  EKG   Radiology No results found.  Procedures Procedures (including critical care time)  Medications Ordered in UC Medications - No data to display  Initial Impression / Assessment and Plan / UC Course  I have reviewed the triage vital signs and the nursing notes.  Pertinent labs & imaging results that were available during my care of the patient were reviewed by me and considered in my medical decision making (see chart for details).     URI symptoms greater than 10 days-treating for sinusitis with Augmentin, continue symptomatic and supportive care of cough and congestion as well, questionable possible COVID infection given at home positive, but follow-up testing negative.  Discussed strict return precautions. Patient verbalized understanding and is agreeable with plan.  Final Clinical Impressions(s) / UC Diagnoses   Final diagnoses:  Acute sinusitis with symptoms > 10 days     Discharge Instructions     Begin Augmentin twice daily for the next week Daily cetirizine and Flonase to help with sinus congestion pressure and drainage Tessalon every 8 hours for cough May use over-the-counter Mucinex DM for further  relief of congestion and cough Rest and fluids Follow-up if not improving or worsening    ED Prescriptions    Medication Sig Dispense Auth. Provider   amoxicillin-clavulanate (AUGMENTIN) 875-125 MG tablet Take 1 tablet by mouth every 12 (twelve) hours for 7 days. 14 tablet Jessina Marse C, PA-C   fluticasone (FLONASE) 50 MCG/ACT nasal spray Place 1-2 sprays into both nostrils daily. 16 g Acsa Estey C, PA-C   Cetirizine HCl 10 MG CAPS Take 1 capsule (10 mg total) by mouth daily for 10 days. 10 capsule Sharyon Cable, [place]  C, PA-C   benzonatate (TESSALON) 200 MG capsule Take 1 capsule (200 mg total) by mouth 3 (three) times daily as needed for up to 7 days for cough. 28 capsule Maat Kafer, Preston C, PA-C     PDMP not reviewed this encounter.   Lew Dawes, [place] 08/22/20 1653

## 2020-08-22 NOTE — Discharge Instructions (Signed)
Begin Augmentin twice daily for the next week Daily cetirizine and Flonase to help with sinus congestion pressure and drainage Tessalon every 8 hours for cough May use over-the-counter Mucinex DM for further relief of congestion and cough Rest and fluids Follow-up if not improving or worsening

## 2020-09-15 ENCOUNTER — Ambulatory Visit: Payer: Self-pay

## 2020-09-16 ENCOUNTER — Ambulatory Visit: Payer: Self-pay

## 2020-10-17 ENCOUNTER — Ambulatory Visit: Payer: Federal, State, Local not specified - PPO

## 2020-10-17 DIAGNOSIS — Z20822 Contact with and (suspected) exposure to covid-19: Secondary | ICD-10-CM | POA: Diagnosis not present

## 2020-11-02 ENCOUNTER — Ambulatory Visit
Admission: RE | Admit: 2020-11-02 | Discharge: 2020-11-02 | Disposition: A | Discharge status: Home / Self Care | Payer: Federal, State, Local not specified - PPO | Source: Ambulatory Visit | Attending: Urgent Care | Admitting: Urgent Care

## 2020-11-02 ENCOUNTER — Other Ambulatory Visit: Payer: Self-pay

## 2020-11-02 VITALS — BP 132/94 | HR 71 | Temp 98.1°F | Resp 18

## 2020-11-02 DIAGNOSIS — M545 Low back pain, unspecified: Secondary | ICD-10-CM

## 2020-11-02 DIAGNOSIS — S39012A Strain of muscle, fascia and tendon of lower back, initial encounter: Secondary | ICD-10-CM

## 2020-11-02 MED ORDER — TIZANIDINE HCL 4 MG PO TABS: 4.0000 mg | ORAL_TABLET | Freq: Four times a day (QID) | ORAL | 0 refills | Status: DC | PRN

## 2020-11-02 MED ORDER — NAPROXEN 500 MG PO TABS: 500.0000 mg | ORAL_TABLET | Freq: Two times a day (BID) | ORAL | 0 refills | Status: DC

## 2020-11-02 NOTE — ED Triage Notes (Signed)
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 No LE pain or n/t. No meds taken. Bending aggravates sxs.

## 2020-11-02 NOTE — ED Provider Notes (Signed)
[facility]   MRN: [id number] DOB: 10-07-1990  Subjective:   Kirk Fitzgerald is a 30 y.o. male presenting for 4-day history of persistent left lower back pain and stiffness.  Symptoms are worse when he tries to bend or twist at the level of the low back.  Symptoms started while he was at work and.  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  Shortly thereafter his pain started has not been using medications for relief.  Wanted to get an evaluation prior to returning to work.  No current facility-administered medications for this encounter.  Current Outpatient Medications:    Cetirizine HCl 10 MG CAPS, Take 1 capsule (10 mg total) by mouth daily for 10 days., Disp: 10 capsule, Rfl: 0   fluticasone (FLONASE) 50 MCG/ACT nasal spray, Place 1-2 sprays into both nostrils daily., Disp: 16 g, Rfl: 0   Allergies  Allergen Reactions   Pollen Extract     History reviewed. No pertinent past medical history.   Past Surgical History:  Procedure Laterality Date   FOOT SURGERY Right     Family History  Family history unknown: Yes    Social History   Tobacco Use   Smoking status: Never   Smokeless tobacco: Never  Vaping Use   Vaping Use: Never used  Substance Use Topics   Alcohol use: Yes   Drug use: Never    ROS   Objective:   Vitals: BP (!) 132/94   Pulse 71   Temp 98.1 F (36.7 C) (Oral)   Resp 18   SpO2 97%   Physical Exam Constitutional:      General: He is not in acute distress.    Appearance: Normal appearance. He is well-developed and normal weight. He is not ill-appearing, toxic-appearing or diaphoretic.  HENT:     Head: Normocephalic and atraumatic.     Right Ear: External ear normal.     Left Ear: External ear normal.     Nose: Nose normal.     Mouth/Throat:     Pharynx: Oropharynx is clear.  Eyes:     General: No scleral icterus.       Right  eye: No discharge.        Left eye: No discharge.     Extraocular Movements: Extraocular movements intact.     Pupils: Pupils are equal, round, and reactive to light.  Cardiovascular:     Rate and Rhythm: Normal rate.  Pulmonary:     Effort: Pulmonary effort is normal.  Musculoskeletal:     Cervical back: Normal range of motion.     Comments: Full range of motion throughout.  Strength 5/5 for lower extremities.  Patient ambulates without any assistance at expected pace.  No ecchymosis, swelling, lacerations or abrasions.  Patient does have paraspinal muscle tenderness along the mid to left low back including paraspinal muscles of the lumbar region excluding the midline.    Neurological:     Mental Status: He is alert and oriented to person, place, and time.     Motor: No weakness.     Coordination: Coordination normal.     Gait: Gait normal.     Deep Tendon Reflexes: Reflexes normal.  Psychiatric:        Mood and Affect: Mood normal.        Behavior: Behavior normal.        Thought Content: Thought  content normal.        Judgment: Judgment normal.      Assessment and Plan :   PDMP not reviewed this encounter.  1. Acute left-sided low back pain without sciatica   2. Lumbar strain, initial encounter     Will manage conservatively for back strain with NSAID and muscle relaxant, rest and modification of physical activity.  Anticipatory guidance provided.  Counseled patient on potential for adverse effects with medications prescribed/recommended today, ER and return-to-clinic precautions discussed, patient verbalized understanding.    Wallis Bamberg, PA-C 11/02/20 1718

## 2020-11-28 ENCOUNTER — Ambulatory Visit: Payer: Federal, State, Local not specified - PPO

## 2021-01-03 DIAGNOSIS — F411 Generalized anxiety disorder: Secondary | ICD-10-CM | POA: Diagnosis not present

## 2021-01-03 DIAGNOSIS — R112 Nausea with vomiting, unspecified: Secondary | ICD-10-CM | POA: Diagnosis not present

## 2021-01-03 DIAGNOSIS — L309 Dermatitis, unspecified: Secondary | ICD-10-CM | POA: Diagnosis not present

## 2021-04-21 DIAGNOSIS — Z03818 Encounter for observation for suspected exposure to other biological agents ruled out: Secondary | ICD-10-CM | POA: Diagnosis not present

## 2021-04-21 DIAGNOSIS — Z20822 Contact with and (suspected) exposure to covid-19: Secondary | ICD-10-CM | POA: Diagnosis not present

## 2021-04-25 DIAGNOSIS — Z20822 Contact with and (suspected) exposure to covid-19: Secondary | ICD-10-CM | POA: Diagnosis not present

## 2021-04-25 DIAGNOSIS — Z03818 Encounter for observation for suspected exposure to other biological agents ruled out: Secondary | ICD-10-CM | POA: Diagnosis not present

## 2021-05-08 ENCOUNTER — Other Ambulatory Visit: Payer: Self-pay

## 2021-05-08 ENCOUNTER — Ambulatory Visit
Admission: RE | Admit: 2021-05-08 | Discharge: 2021-05-08 | Disposition: A | Discharge status: Home / Self Care | Payer: Federal, State, Local not specified - PPO | Source: Ambulatory Visit | Attending: Emergency Medicine | Admitting: Emergency Medicine

## 2021-05-08 VITALS — BP 140/94 | HR 65 | Temp 98.1°F | Resp 18

## 2021-05-08 DIAGNOSIS — S99911A Unspecified injury of right ankle, initial encounter: Secondary | ICD-10-CM

## 2021-05-08 NOTE — Discharge Instructions (Addendum)
It is more than likely that you have sprained your ankle however given the amount of pain that you are having right now, it is important that we rule out any fractures.  Recommend that you go to the [facility] location at your earliest convenience to have x-ray done of your right ankle.  Once the results are received here at our clinic, we will notify you of your results.  Please continue to wear the stabilizing ankle brace that we provided you today.  Please also continue ibuprofen for your pain.  When you are resting, please keep the foot elevated to avoid excessive swelling.  Please also apply ice whenever you are able.

## 2021-05-08 NOTE — ED Triage Notes (Signed)
Pt injured his left ankle playing basketball 2 days ago. No history of falls.

## 2021-05-08 NOTE — ED Provider Notes (Signed)
[facility]    CSN: [id number] Arrival date & time: 05/08/21  1322    HISTORY   Chief Complaint  Patient presents with   Ankle Pain   HPI Kirk Fitzgerald is a 31 y.o. male. Pt injured his left ankle playing basketball 2 days ago.  Patient reports no prior injury to his right ankle.  Patient states he rolled his ankle outward, did not hear a popping or feel a popping, states initially it felt okay but the day after he noticed it was very sore.  Patient states he is able to walk on it very carefully however when he was walking his dog his dog jerked the leash which caused him to plant his foot firmly, this resulted in a lot of pain.  Patient states he here today to have his ankle "checked out".  The history is provided by the patient.  History reviewed. No pertinent past medical history. There are no problems to display for this patient.  Past Surgical History:  Procedure Laterality Date   FOOT SURGERY Right     Home Medications    Prior to Admission medications   Not on File    Family History Family History  Family history unknown: Yes   Social History Social History   Tobacco Use   Smoking status: Never   Smokeless tobacco: Never  Vaping Use   Vaping Use: Never used  Substance Use Topics   Alcohol use: Yes   Drug use: Never   Allergies   Pollen extract  Review of Systems Review of Systems Pertinent findings noted in history of present illness.   Physical Exam Triage Vital Signs ED Triage Vitals  Enc Vitals Group     BP 02/17/21 0827 (!) 147/82     Pulse Rate 02/17/21 0827 72     Resp 02/17/21 0827 18     Temp 02/17/21 0827 98.3 F (36.8 C)     Temp Source 02/17/21 0827 Oral     SpO2 02/17/21 0827 98 %     Weight --      Height --      Head Circumference --      Peak Flow --      Pain Score 02/17/21 0826 5     Pain Loc --      Pain Edu? --      Excl. in GC? --   No data found.  Updated Vital Signs BP (!) 140/94 (BP Location: Left  Arm)    Pulse 65    Temp 98.1 F (36.7 C) (Oral)    Resp 18    SpO2 98%   Physical Exam Vitals and nursing note reviewed.  Constitutional:      General: He is not in acute distress.    Appearance: Normal appearance. He is not ill-appearing.  HENT:     Head: Normocephalic and atraumatic.  Eyes:     General: Lids are normal.        Right eye: No discharge.        Left eye: No discharge.     Extraocular Movements: Extraocular movements intact.     Conjunctiva/sclera: Conjunctivae normal.     Right eye: Right conjunctiva is not injected.     Left eye: Left conjunctiva is not injected.  Neck:     Trachea: Trachea and phonation normal.  Cardiovascular:     Rate and Rhythm: Normal rate and regular rhythm.     Pulses: Normal pulses.  Heart sounds: Normal heart sounds. No murmur heard.   No friction rub. No gallop.  Pulmonary:     Effort: Pulmonary effort is normal. No accessory muscle usage, prolonged expiration or respiratory distress.     Breath sounds: Normal breath sounds. No stridor, decreased air movement or transmitted upper airway sounds. No decreased breath sounds, wheezing, rhonchi or rales.  Chest:     Chest wall: No tenderness.  Musculoskeletal:        General: Swelling, tenderness and signs of injury present. Normal range of motion.     Cervical back: Normal range of motion and neck supple. Normal range of motion.     Right lower leg: No edema.     Comments: Injury to lateral aspect of right ankle.  Lymphadenopathy:     Cervical: No cervical adenopathy.  Skin:    General: Skin is warm and dry.     Findings: No erythema or rash.  Neurological:     General: No focal deficit present.     Mental Status: He is alert and oriented to person, place, and time.  Psychiatric:        Mood and Affect: Mood normal.        Behavior: Behavior normal.    Visual Acuity Right Eye Distance:   Left Eye Distance:   Bilateral Distance:    Right Eye Near:   Left Eye Near:     Bilateral Near:     UC Couse / Diagnostics / Procedures:    EKG  Radiology No results found.  Procedures Procedures (including critical care time)  UC Diagnoses / Final Clinical Impressions(s)   I have reviewed the triage vital signs and the nursing notes.  Pertinent labs & imaging results that were available during my care of the patient were reviewed by me and considered in my medical decision making (see chart for details).    Final diagnoses:  Injury of right ankle, initial encounter   Patient placed in a stabilizing ankle brace, x-ray ordered.  Patient states he is unable to go today but will go soon as possible.  Recommend ibuprofen, rest, ice and elevation.  Note provided for work.  ED Prescriptions   None    PDMP not reviewed this encounter.  Pending results:  Labs Reviewed - No data to display  Medications Ordered in UC: Medications - No data to display  Disposition Upon Discharge:  Condition: stable for discharge home Home: take medications as prescribed; routine discharge instructions as discussed; follow up as advised.  Patient presented with an acute illness with associated systemic symptoms and significant discomfort requiring urgent management. In my opinion, this is a condition that a prudent lay person (someone who possesses an average knowledge of health and medicine) may potentially expect to result in complications if not addressed urgently such as respiratory distress, impairment of bodily function or dysfunction of bodily organs.   Routine symptom specific, illness specific and/or disease specific instructions were discussed with the patient and/or caregiver at length.   As such, the patient has been evaluated and assessed, work-up was performed and treatment was provided in alignment with urgent care protocols and evidence based medicine.  Patient/parent/caregiver has been advised that the patient may require follow up for further testing and  treatment if the symptoms continue in spite of treatment, as clinically indicated and appropriate.  If the patient was tested for COVID-19, Influenza and/or RSV, then the patient/parent/guardian was advised to isolate at home pending the results of his/her diagnostic  coronavirus test and potentially longer if theyre positive. I have also advised pt that if his/her COVID-19 test returns positive, it's recommended to self-isolate for at least 10 days after symptoms first appeared AND until fever-free for 24 hours without fever reducer AND other symptoms have improved or resolved. Discussed self-isolation recommendations as well as instructions for household member/close contacts as per the [facility] and [place] [facility]HS, and also gave patient the COVID packet with this information.  Patient/parent/caregiver has been advised to return to the [facility] or PCP in 3-5 days if no better; to PCP or the Emergency Department if new signs and symptoms develop, or if the current signs or symptoms continue to change or worsen for further workup, evaluation and treatment as clinically indicated and appropriate  The patient will follow up with their current PCP if and as advised. If the patient does not currently have a PCP we will assist them in obtaining one.   The patient may need specialty follow up if the symptoms continue, in spite of conservative treatment and management, for further workup, evaluation, consultation and treatment as clinically indicated and appropriate.   Patient/parent/caregiver verbalized understanding and agreement of plan as discussed.  All questions were addressed during visit.  Please see discharge instructions below for further details of plan.  Discharge Instructions:   Discharge Instructions      It is more than likely that you have sprained your ankle however given the amount of pain that you are having right now, it is important that we rule out any fractures.  Recommend that you go to the  [facility] location at your earliest convenience to have x-ray done of your right ankle.  Once the results are received here at our clinic, we will notify you of your results.  Please continue to wear the stabilizing ankle brace that we provided you today.  Please also continue ibuprofen for your pain.  When you are resting, please keep the foot elevated to avoid excessive swelling.  Please also apply ice whenever you are able.      This office note has been dictated using Teaching laboratory technician.  Unfortunately, and despite my best efforts, this method of dictation can sometimes lead to occasional typographical or grammatical errors.  I apologize in advance if this occurs.     Theadora Rama Scales, PA-C 05/08/21 1358

## 2021-05-16 ENCOUNTER — Telehealth (HOSPITAL_BASED_OUTPATIENT_CLINIC_OR_DEPARTMENT_OTHER): Payer: Self-pay

## 2021-06-15 DIAGNOSIS — A630 Anogenital (venereal) warts: Secondary | ICD-10-CM | POA: Diagnosis not present

## 2021-06-15 DIAGNOSIS — L858 Other specified epidermal thickening: Secondary | ICD-10-CM | POA: Diagnosis not present

## 2021-06-21 DIAGNOSIS — A630 Anogenital (venereal) warts: Secondary | ICD-10-CM | POA: Diagnosis not present

## 2021-07-13 DIAGNOSIS — S93601A Unspecified sprain of right foot, initial encounter: Secondary | ICD-10-CM | POA: Diagnosis not present

## 2021-07-13 DIAGNOSIS — M25571 Pain in right ankle and joints of right foot: Secondary | ICD-10-CM | POA: Diagnosis not present

## 2021-08-02 IMAGING — DX DG HAND COMPLETE 3+V*R*
2 series · 2 of 2 positions shown · non-contrast
Comparison: None.

CLINICAL DATA: Injured hand playing basketball. Pain and swelling
involving the fourth finger.

EXAM:
RIGHT HAND - COMPLETE 3+ VIEW

[hand mlo]
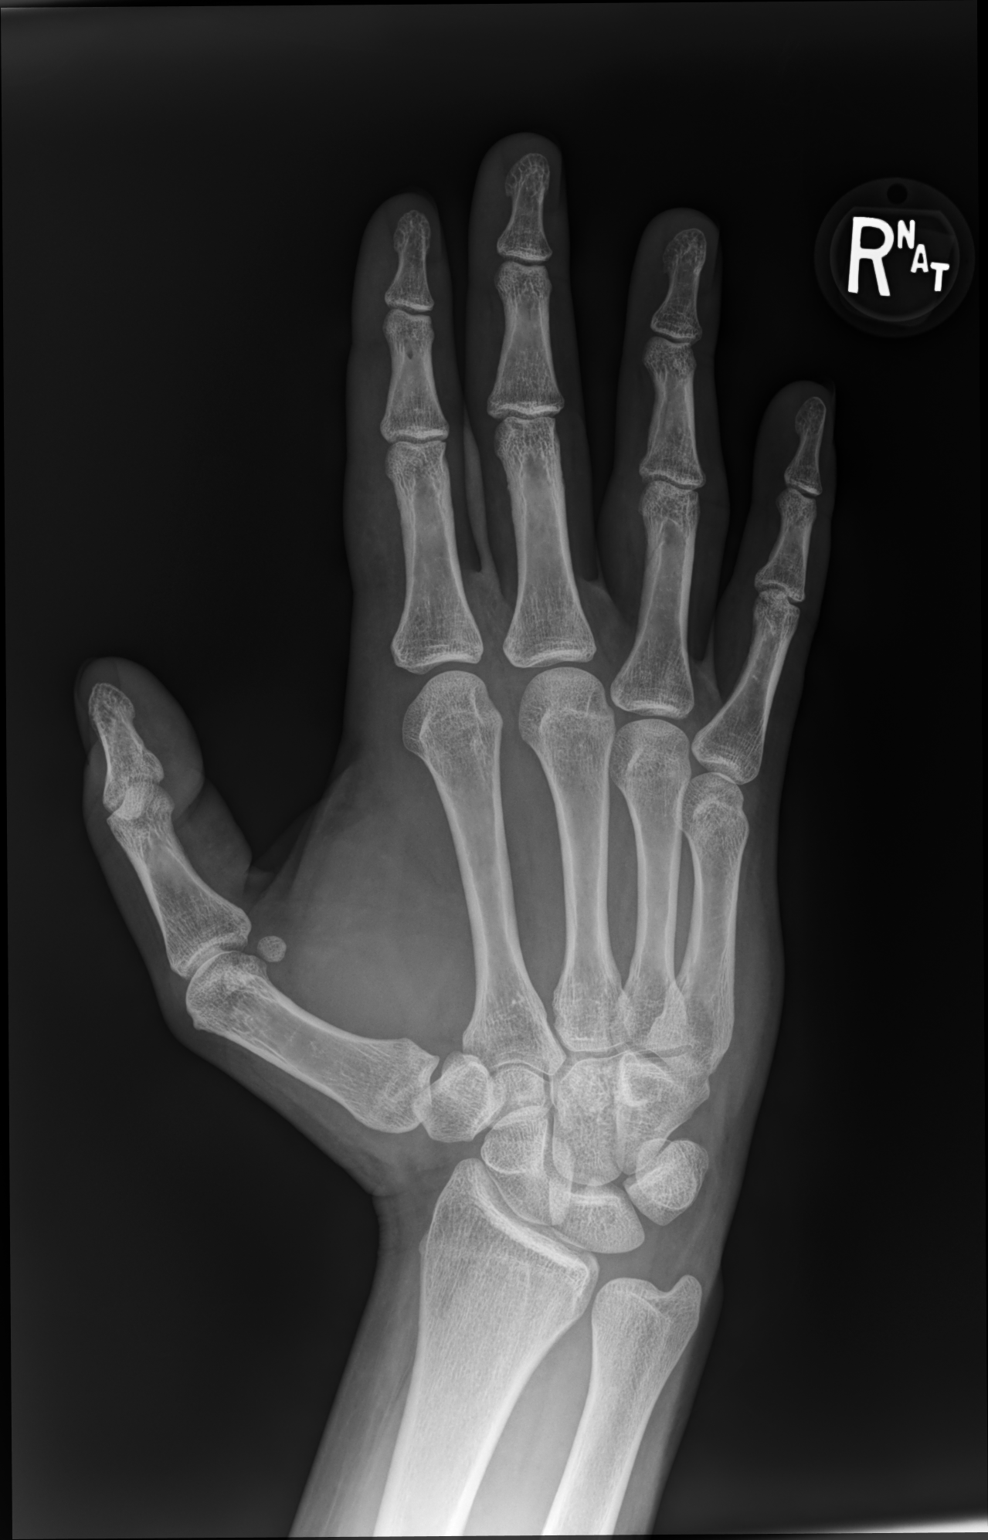

[hand lat]
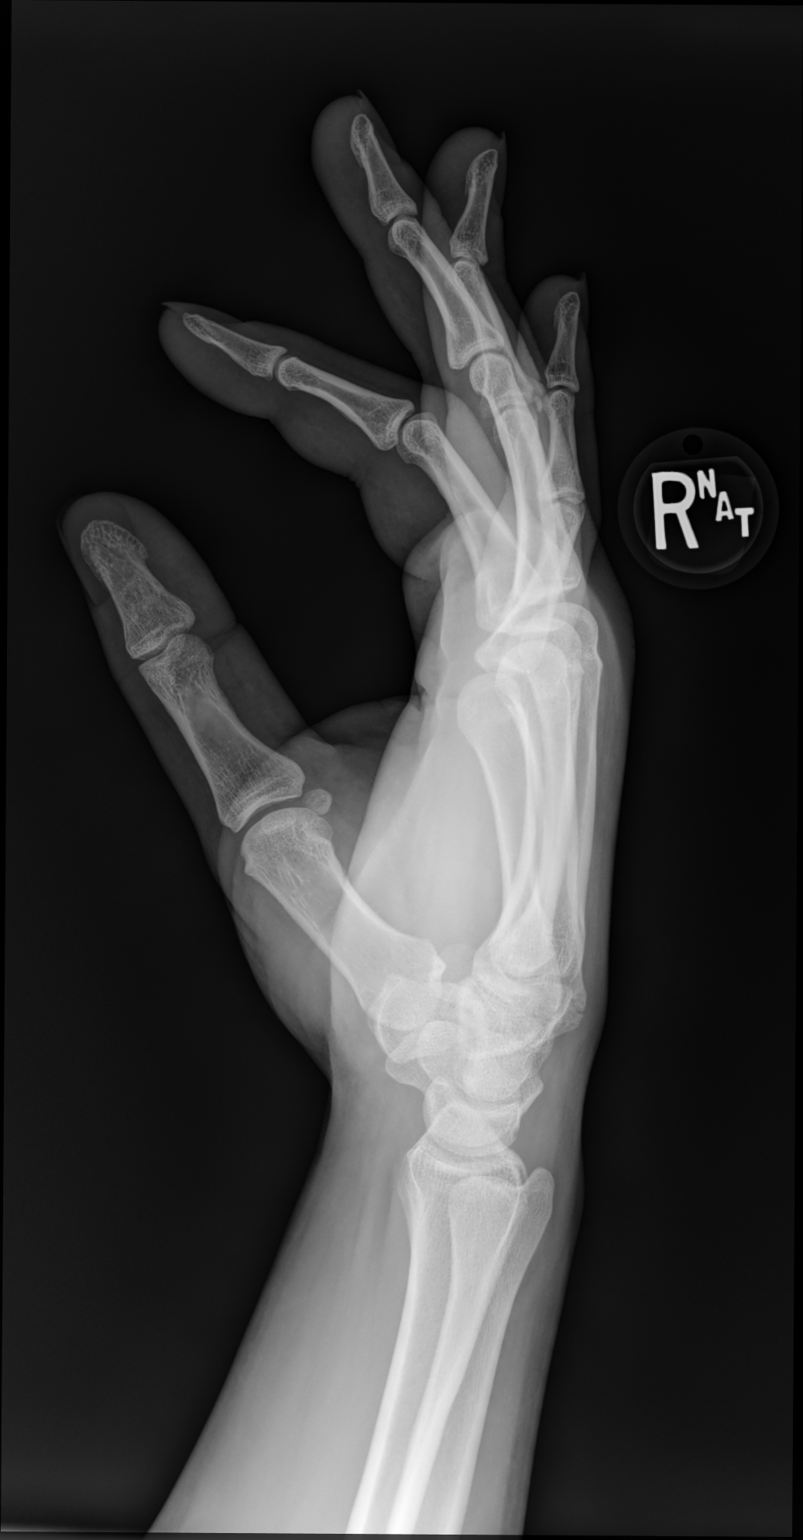

[2 of 2 positions shown; findings below may reference images not displayed]

FINDINGS: The joint spaces are maintained.

On the lateral film I suspect there may be dorsal plate avulsion
fracture involving the PIP joint of the fourth finger but the finger
is not isolated on the lateral film. Recommend a dedicated isolated
lateral film of the right ring finger.

No other bony abnormalities are identified.
IMPRESSION: Suspect dorsal plate avulsion fracture involving the PIP joint of
the fourth finger. Recommend a dedicated isolated lateral film of
that finger.

## 2021-10-11 ENCOUNTER — Ambulatory Visit: Payer: Self-pay

## 2021-10-16 ENCOUNTER — Ambulatory Visit
Admission: RE | Admit: 2021-10-16 | Discharge: 2021-10-16 | Disposition: A | Discharge status: Home / Self Care | Payer: Federal, State, Local not specified - PPO | Source: Ambulatory Visit | Attending: Family Medicine | Admitting: Family Medicine

## 2021-10-16 VITALS — BP 140/82 | HR 83 | Temp 98.4°F | Resp 20

## 2021-10-16 DIAGNOSIS — M25571 Pain in right ankle and joints of right foot: Secondary | ICD-10-CM

## 2021-10-16 NOTE — ED Triage Notes (Signed)
Patient states he twisted his right ankle a few weeks ago and is better now but is needing clearance to return to work.

## 2021-12-01 ENCOUNTER — Ambulatory Visit: Payer: Self-pay

## 2021-12-30 DIAGNOSIS — R000 Tachycardia, unspecified: Secondary | ICD-10-CM | POA: Diagnosis not present

## 2021-12-30 DIAGNOSIS — D72829 Elevated white blood cell count, unspecified: Secondary | ICD-10-CM | POA: Diagnosis not present

## 2021-12-30 DIAGNOSIS — Z20822 Contact with and (suspected) exposure to covid-19: Secondary | ICD-10-CM | POA: Diagnosis not present

## 2021-12-30 DIAGNOSIS — R569 Unspecified convulsions: Secondary | ICD-10-CM | POA: Diagnosis not present

## 2021-12-30 DIAGNOSIS — R7401 Elevation of levels of liver transaminase levels: Secondary | ICD-10-CM | POA: Diagnosis not present

## 2021-12-30 DIAGNOSIS — T68XXXA Hypothermia, initial encounter: Secondary | ICD-10-CM | POA: Diagnosis not present

## 2021-12-30 DIAGNOSIS — R918 Other nonspecific abnormal finding of lung field: Secondary | ICD-10-CM | POA: Diagnosis not present

## 2021-12-30 DIAGNOSIS — R0689 Other abnormalities of breathing: Secondary | ICD-10-CM | POA: Diagnosis not present

## 2021-12-30 DIAGNOSIS — R059 Cough, unspecified: Secondary | ICD-10-CM | POA: Diagnosis not present

## 2021-12-30 DIAGNOSIS — R4182 Altered mental status, unspecified: Secondary | ICD-10-CM | POA: Diagnosis not present

## 2021-12-30 DIAGNOSIS — I959 Hypotension, unspecified: Secondary | ICD-10-CM | POA: Diagnosis not present

## 2021-12-30 DIAGNOSIS — E538 Deficiency of other specified B group vitamins: Secondary | ICD-10-CM | POA: Diagnosis not present

## 2021-12-30 DIAGNOSIS — G9381 Temporal sclerosis: Secondary | ICD-10-CM | POA: Diagnosis not present

## 2021-12-30 DIAGNOSIS — G929 Unspecified toxic encephalopathy: Secondary | ICD-10-CM | POA: Diagnosis not present

## 2021-12-30 DIAGNOSIS — D539 Nutritional anemia, unspecified: Secondary | ICD-10-CM | POA: Diagnosis not present

## 2021-12-30 DIAGNOSIS — R748 Abnormal levels of other serum enzymes: Secondary | ICD-10-CM | POA: Diagnosis not present

## 2021-12-31 DIAGNOSIS — R569 Unspecified convulsions: Secondary | ICD-10-CM | POA: Diagnosis not present

## 2022-01-01 DIAGNOSIS — G929 Unspecified toxic encephalopathy: Secondary | ICD-10-CM | POA: Diagnosis not present

## 2022-01-01 DIAGNOSIS — R4182 Altered mental status, unspecified: Secondary | ICD-10-CM | POA: Diagnosis not present

## 2022-01-01 DIAGNOSIS — R569 Unspecified convulsions: Secondary | ICD-10-CM | POA: Diagnosis not present

## 2022-01-01 DIAGNOSIS — R748 Abnormal levels of other serum enzymes: Secondary | ICD-10-CM | POA: Diagnosis not present

## 2022-01-08 DIAGNOSIS — R945 Abnormal results of liver function studies: Secondary | ICD-10-CM | POA: Diagnosis not present

## 2022-01-08 DIAGNOSIS — G40109 Localization-related (focal) (partial) symptomatic epilepsy and epileptic syndromes with simple partial seizures, not intractable, without status epilepticus: Secondary | ICD-10-CM | POA: Diagnosis not present

## 2022-01-08 DIAGNOSIS — Z09 Encounter for follow-up examination after completed treatment for conditions other than malignant neoplasm: Secondary | ICD-10-CM | POA: Diagnosis not present

## 2022-01-24 ENCOUNTER — Ambulatory Visit: Payer: Federal, State, Local not specified - PPO | Admitting: Neurology

## 2022-01-24 ENCOUNTER — Encounter: Payer: Self-pay | Admitting: Neurology

## 2022-01-24 VITALS — BP 116/78 | HR 69 | Ht 76.0 in | Wt 286.2 lb

## 2022-01-24 DIAGNOSIS — Z5181 Encounter for therapeutic drug level monitoring: Secondary | ICD-10-CM

## 2022-01-24 DIAGNOSIS — G40009 Localization-related (focal) (partial) idiopathic epilepsy and epileptic syndromes with seizures of localized onset, not intractable, without status epilepticus: Secondary | ICD-10-CM | POA: Diagnosis not present

## 2022-01-24 MED ORDER — LEVETIRACETAM 750 MG PO TABS: 750.0000 mg | ORAL_TABLET | Freq: Two times a day (BID) | ORAL | 3 refills | Status: DC

## 2022-01-24 NOTE — Progress Notes (Signed)
[facility]  PATIENT: Kirk Fitzgerald DOB: 1991-04-12  REQUESTING CLINICIAN: Farris Has, MD HISTORY FROM: Patient and mother and also chart review  REASON FOR VISIT: New onset epilepsy    HISTORICAL  CHIEF COMPLAINT:  Chief Complaint  Patient presents with   New Patient (Initial Visit)    RM 12 with mother, Shawna Orleans. Paper referral for seizures. Last seizure 12/29/21. He remembers passing out around 1130/12pm. Unconsciousness for about 14 hr. EMS brought him in to be evaluated at that point. No other seizures since.     HISTORY OF PRESENT ILLNESS:  This is a 31 year old gentleman with no reported past medical history who is presenting to establish care for his new onset epilepsy.  Patient reports on September 9 he was found by his sister around 3 AM in his car.  He was confused and has urinated on himself.  Initially he did not recognize her.  He was taken to [facility] and admitted.  Initially there was some concern for encephalitis/meningitis but LP was negative.  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  Patient reports the last thing that he noted was trying to use the GPS on his phone.  He denies any previous history of seizures but said the day of the seizure he woke up very tired.  He also have instances where he will have what seems to be like a chest tightness.  But denies any generalized convulsion in the past.  At discharge he was started on Keppra 750 twice daily, he tolerates the medication well, denies any side effect.     Handedness: Left hand   Onset: September 9   Seizure Type: unclear, probably focal to bilateral generalized   Current frequency: Only once   Any injuries from seizures: Denies   Seizure risk factors: Denies   Previous ASMs: None   Currenty ASMs: Keppra 750 mg BID   ASMs side effects: None   Brain Images: Left mesial temporal sclerosis   Previous EEGs: Normal     OTHER MEDICAL CONDITIONS: Denies    REVIEW OF SYSTEMS: Full 14 system review of systems performed and negative with exception of: As noted in the HPI   ALLERGIES: Allergies  Allergen Reactions   Pollen Extract     HOME MEDICATIONS: Outpatient Medications Prior to Visit  Medication Sig Dispense Refill   cyanocobalamin (VITAMIN B12) 1000 MCG tablet Take 1 tablet by mouth daily.     levETIRAcetam (KEPPRA) 750 MG tablet Take 750 mg by mouth 2 (two) times daily.     No facility-administered medications prior to visit.    PAST MEDICAL HISTORY: History reviewed. No pertinent past medical history.  PAST SURGICAL HISTORY: Past Surgical History:  Procedure Laterality Date   FOOT SURGERY Right     FAMILY HISTORY: Family History  Problem Relation Age of Onset   Hypertension Mother    Atrial fibrillation Mother    Heart attack Father    Hypertension Father    Prostate cancer Father     SOCIAL HISTORY: Social History   Socioeconomic History   Marital status: Single    Spouse name: Not on file   Number of children: 0   Years of education: College   Highest education level: Not on file  Occupational History   Not on file  Tobacco Use   Smoking status: Never   Smokeless tobacco: Never  Vaping Use   Vaping Use: Never used  Substance  and Sexual Activity   Alcohol use: Yes   Drug use: Yes    Types: Marijuana    Comment: Uses every other day   Sexual activity: Yes  Other Topics Concern   Not on file  Social History Narrative   Left handed w/ writing, does all other activities right handed   Caffeine use: Soda (1 per day)   Social Determinants of Corporate investment banker Strain: Not on file  Food Insecurity: Not on file  Transportation Needs: Not on file  Physical Activity: Not on file  Stress: Not on file  Social Connections: Not on file  Intimate Partner Violence: Not on file    PHYSICAL EXAM  GENERAL EXAM/CONSTITUTIONAL: Vitals:  Vitals:    01/24/22 0823  BP: 116/78  Pulse: 69  Weight: 286 lb 3.2 oz (129.8 kg)  Height: 6\' 4"  (1.93 m)   Body mass index is 34.84 kg/m. Wt Readings from Last 3 Encounters:  01/24/22 286 lb 3.2 oz (129.8 kg)  06/05/17 260 lb (117.9 kg)   Patient is in no distress; well developed, nourished and groomed; neck is supple  EYES: Pupils round and reactive to light, Visual fields full to confrontation, Extraocular movements intacts,  No results found.  MUSCULOSKELETAL: Gait, strength, tone, movements noted in Neurologic exam below  NEUROLOGIC: MENTAL STATUS:      No data to display         awake, alert, oriented to person, place and time recent and remote memory intact normal attention and concentration language fluent, comprehension intact, naming intact fund of knowledge appropriate  CRANIAL NERVE:  2nd, 3rd, 4th, 6th - pupils equal and reactive to light, visual fields full to confrontation, extraocular muscles intact, no nystagmus 5th - facial sensation symmetric 7th - facial strength symmetric 8th - hearing intact 9th - palate elevates symmetrically, uvula midline 11th - shoulder shrug symmetric 12th - tongue protrusion midline  MOTOR:  normal bulk and tone, full strength in the BUE, BLE  SENSORY:  normal and symmetric to light touch  COORDINATION:  finger-nose-finger, fine finger movements normal  REFLEXES:  deep tendon reflexes present and symmetric  GAIT/STATION:  normal   DIAGNOSTIC DATA (LABS, IMAGING, TESTING) - I reviewed patient records, labs, notes, testing and imaging myself where available.  Lab Results  Component Value Date   HGB 15.3 06/05/2017   HCT 45.0 06/05/2017      Component Value Date/Time   NA 140 06/05/2017 0812   K 4.4 06/05/2017 0812   CL 102 06/05/2017 0812   GLUCOSE 99 06/05/2017 0812   BUN 10 06/05/2017 0812   CREATININE 1.00 06/05/2017 0812   No results found for: "CHOL", "HDL", "LDLCALC", "LDLDIRECT", "TRIG" No results  found for: "HGBA1C" No results found for: "VITAMINB12" No results found for: "TSH"   Routine EEG 12/31/21 Normal EEGs, however, do not rule out epilepsy.   MRI Brain with and without contrast 12/30/21 Findings suggestive of left mesial temporal sclerosis, possibly the source  of seizures.     ASSESSMENT AND PLAN  31 y.o. year old male  with no past medical history who is presenting to establish care for his new onset epilepsy.  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  He is on Keppra 750 mg twice daily, denies any side effect therefore we will continue same medication.  I will also obtain a ambulatory EEG since the routine was normal.  I will see him in 3 months for  follow-up or sooner if worse.  He voices understanding.  Patient understand to contact me if he has a breakthrough seizure.   1. Localization-related idiopathic epilepsy and epileptic syndromes with seizures of localized onset, not intractable, without status epilepticus (HCC)   2. 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  Other recommendations include using caution when using heavy equipment or power tools. Avoid working on ladders or at heights. Take showers instead of baths.  Do not swim alone.  Ensure the water temperature is not too high on the home water heater. Do not go swimming alone. Do not lock yourself in a room alone (i.e. bathroom). When caring for infants or small children, sit down when holding, feeding, or changing them to minimize risk of injury to the child in the  event you have a seizure. Maintain good sleep hygiene. Avoid alcohol.  Also recommend adequate sleep, hydration, good diet and minimize stress.   During the Seizure  - First, ensure adequate ventilation and place patients on the floor on their left side  Loosen clothing around the neck and ensure the airway is patent. If the patient is clenching the teeth, do not force the mouth open with any object as this can cause severe damage - Remove all items from the surrounding that can be hazardous. The patient may be oblivious to what's happening and may not even know what he or she is doing. If the patient is confused and wandering, either gently guide him/her away and block access to outside areas - Reassure the individual and be comforting - Call 911. In most cases, the seizure ends before EMS arrives. However, there are cases when seizures may last over 3 to 5 minutes. Or the individual may have developed breathing difficulties or severe injuries. If a pregnant patient or a person with diabetes develops a seizure, it is prudent to call an ambulance. - Finally, if the patient does not regain full consciousness, then call EMS. Most patients will remain confused for about 45 to 90 minutes after a seizure, so you must use judgment in calling for help. - Avoid restraints but make sure the patient is in a bed with padded side rails - Place the individual in a lateral position with the neck slightly flexed; this will help the saliva drain from the mouth and prevent the tongue from falling backward - Remove all nearby furniture and other hazards from the area - Provide verbal assurance as the individual is regaining consciousness - Provide the patient with privacy if possible - Call for help and start treatment as ordered by the caregiver   After the Seizure (Postictal Stage)  After a seizure, most patients experience confusion, fatigue, muscle pain and/or a headache. Thus, one should permit the  individual to sleep. For the next few days, reassurance is essential. Being calm and helping reorient the person is also of importance.  Most seizures are painless and end spontaneously. Seizures are not harmful to others but can lead to complications such as stress on the lungs, brain and the heart. Individuals  with prior lung problems may develop labored breathing and respiratory distress.     Orders Placed This Encounter  Procedures   Levetiracetam level   AMBULATORY EEG    Meds ordered this encounter  Medications   levETIRAcetam (KEPPRA) 750 MG tablet    Sig: Take 1 tablet (750 mg total) by mouth 2 (two) times daily.    Dispense:  180 tablet    Refill:  3    Return in about 3 months (around 04/26/2022).    Alric Ran, MD 01/24/2022, 10:27 AM  [facility] [address] [place], [place] [address] [phone]

## 2022-01-24 NOTE — Patient Instructions (Signed)
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

## 2022-01-26 LAB — LEVETIRACETAM LEVEL: Levetiracetam Lvl: 3.5 ug/mL — ABNORMAL LOW (ref 10.0–40.0)

## 2022-01-26 MED ORDER — LEVETIRACETAM 1000 MG PO TABS: 1000.0000 mg | ORAL_TABLET | Freq: Two times a day (BID) | ORAL | 3 refills | Status: DC

## 2022-01-26 NOTE — Addendum Note (Signed)
Addended byAlric Ran on: 01/26/2022 11:49 AM   Modules accepted: Orders

## 2022-02-02 ENCOUNTER — Ambulatory Visit: Payer: Federal, State, Local not specified - PPO | Admitting: Neurology

## 2022-02-27 DIAGNOSIS — R404 Transient alteration of awareness: Secondary | ICD-10-CM | POA: Diagnosis not present

## 2022-02-27 DIAGNOSIS — R569 Unspecified convulsions: Secondary | ICD-10-CM | POA: Diagnosis not present

## 2022-02-28 DIAGNOSIS — R569 Unspecified convulsions: Secondary | ICD-10-CM | POA: Diagnosis not present

## 2022-02-28 DIAGNOSIS — R404 Transient alteration of awareness: Secondary | ICD-10-CM | POA: Diagnosis not present

## 2022-03-09 ENCOUNTER — Encounter: Payer: Self-pay | Admitting: Neurology

## 2022-03-09 DIAGNOSIS — Z5181 Encounter for therapeutic drug level monitoring: Secondary | ICD-10-CM

## 2022-03-09 DIAGNOSIS — G40009 Localization-related (focal) (partial) idiopathic epilepsy and epileptic syndromes with seizures of localized onset, not intractable, without status epilepticus: Secondary | ICD-10-CM

## 2022-03-09 NOTE — Procedures (Signed)
Clinical History : This is a 31 y/o M who presents with seizure like events consisting of being found by sister at 3am in his car confused with urinary incontinence. He initially did not recognize her. 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  INTERMITTENT MONITORING with VIDEO TECHNICAL SUMMARY: This AVEEG was performed using equipment provided by [employer] utilizing Bluetooth ( [employer] ) amplifiers with continuous EEGT attended video collection using encrypted remote transmission via [employer] secured cellular tower network with data rates for each AVEEG performed. This is a Therapist, music AVEEG, obtained, according to the 10-20 international electrode placement system, reformatted digitally into referential and bipolar montages. Data was acquired with a minimum of 21 bipolar connections and sampled at a minimum rate of 250 cycles per second per channel, maximum rate of 450 cycles per second per channel and two channels for EKG. The entire VEEG study was recorded through cable and or radio telemetry for subsequent analysis. Specified epochs of the AVEEG data were identified at the direction of the subject by the depression of a push button by the patient. Each patients event file included data acquired two minutes prior to the push button activation and continuing until two minutes afterwards. AVEEG files were reviewed on [employer] [facility] server, Licensed Software provided by [employer] with a digital high frequency filter set at 70 Hz and a low frequency filter set at 1 Hz with a paper speed of 98mm/s resulting in 10 seconds per digital page. This entire AVEEG was reviewed by the EEG Technologist. Random time samples, random sleep samples, clips, patient initiated push button files with included patient daily diary logs, EEG Technologist pruned data was reviewed and verified for accuracy and validity by the governing reading neurologist in full details. This  AEEGV was fully compliant with all requirements for CPT 97500 for setup, patient education, take down and administered by an EEG technologist. Long-Term EEG with Video was monitored intermittently by a qualified EEG technologist for the entirety of the recording; quality check-ins were performed at a minimum of every two hours, checking and documenting real-time data and video to assure the integrity and quality of the recording (e.g., camera position, electrode integrity and impedance), and identify the need for maintenance. For intermittent monitoring, an EEG Technologist monitored no more than 12 patients concurrently. Diagnostic video was captured at least 80% of the time during the recording.  PATIENT EVENTS: There were no patient events noted or captured during this recording.  TECHNOLOGIST EVENTS: No clear epileptiform activity was detected by the reviewing neurodiagnostic technologist during the recording for further evaluation.  TIME SAMPLES: 10-minutes of every 2 hours recorded are reviewed as random time samples.  SLEEP SAMPLES: 5-minutes of every 24 hours recorded are reviewed as random sleep samples.  AWAKE: At maximal level of alertness, the posterior dominant background activity was continuous, reactive, low voltage rhythm of 9-9.5 Hz. This was symmetric, well-modulated, and attenuated with eye opening. Diffuse, symmetric, frontocentral beta range activity was present.  SLEEP: N1 Sleep (Stage 1) was observed and characterized by the disappearance of alpha rhythm and the appearance of vertex activity. N2 Sleep (Stage 2) was observed and characterized by vertex waves, K-complexes, and sleep spindles. N3 (Stage 3) sleep was observed and characterized by high amplitude Delta activity of 20%.  EKG: There were no arrhythmias or abnormalities noted during this recording.   Impression: This is a normal video ambulatory EEG. There were no seizures and no epileptiform discharges  seen. Normal EEGs, however does not rule out epilepsy.   Windell Norfolk, MD [facility]

## 2022-05-09 ENCOUNTER — Encounter: Payer: Self-pay | Admitting: Neurology

## 2022-05-09 ENCOUNTER — Ambulatory Visit: Payer: Federal, State, Local not specified - PPO | Admitting: Neurology

## 2022-11-15 ENCOUNTER — Telehealth: Payer: Self-pay | Admitting: Neurology

## 2022-11-15 NOTE — Telephone Encounter (Signed)
Lvm to RC 1ST ATTEMPT

## 2022-11-15 NOTE — Telephone Encounter (Signed)
Pt called in and stated that he had a job interview and it requires him to be able to drive he was ok waiting until Teresa Coombs comes back to get him to write the letter and he also stated that he hasn't had a sz in over a year

## 2022-11-15 NOTE — Telephone Encounter (Signed)
Pt is asking for a call to discuss a needed letter from Dr Teresa Coombs.  Pt states it has been almost a year(far beyond 6 mo) since his last seizure.  This is needed for the [facility] so he is able to drive again

## 2022-11-20 ENCOUNTER — Encounter: Payer: Self-pay | Admitting: Neurology

## 2022-11-20 NOTE — Telephone Encounter (Signed)
Lmtcb 1st attempt

## 2022-11-20 NOTE — Telephone Encounter (Signed)
Letter completed.

## 2023-07-08 ENCOUNTER — Ambulatory Visit
Admission: EM | Admit: 2023-07-08 | Discharge: 2023-07-08 | Disposition: A | Discharge status: Home / Self Care | Attending: Family Medicine | Admitting: Family Medicine

## 2023-07-08 DIAGNOSIS — R109 Unspecified abdominal pain: Secondary | ICD-10-CM

## 2023-07-08 DIAGNOSIS — R197 Diarrhea, unspecified: Secondary | ICD-10-CM

## 2023-07-08 NOTE — Discharge Instructions (Addendum)
 You were seen today for diarrhea and abdominal pain.  This was likely something you ate, and it will likely resolve in the next 24 hrs.  I recommend you eat bland food, and drink clear liquids as tolerated to avoid dehydration.  If you continue with symptoms into tomorrow, you may trial over the counter Imodium to help with the diarrhea.  Please return or go to the ER if you have worsening abdominal pain with fevers and chills.

## 2023-07-08 NOTE — ED Provider Notes (Signed)
 [facility]    CSN: [id number] Arrival date & time: 07/08/23  1409      History   Chief Complaint Chief Complaint  Patient presents with   Abdominal Pain    HPI Kirk Fitzgerald is a 33 y.o. male.    Abdominal Pain Associated symptoms: diarrhea    Patient is here for abdominal pain and diarrhea that started this morning.  Pain was at the right mid abdomen, but has improved quite a bit.  No pain currently.  He had about 7 bouts of diarrhea.  No blood, just very loose and watery.  He was nauseated at first, dry heaves, but nothing else.  No fevers/chills.  He did drink water today.  He did go out to eat last night, had some fish, but tasted funny.       History reviewed. No pertinent past medical history.  There are no active problems to display for this patient.   Past Surgical History:  Procedure Laterality Date   FOOT SURGERY Right        Home Medications    Prior to Admission medications   Not on File    Family History Family History  Problem Relation Age of Onset   Hypertension Mother    Atrial fibrillation Mother    Heart attack Father    Hypertension Father    Prostate cancer Father     Social History Social History   Tobacco Use   Smoking status: Never   Smokeless tobacco: Never  Vaping Use   Vaping status: Never Used  Substance Use Topics   Alcohol use: Yes   Drug use: Yes    Types: Marijuana    Comment: Uses every other day     Allergies   Bee pollen and Pollen extract   Review of Systems Review of Systems  Constitutional: Negative.   HENT: Negative.    Respiratory: Negative.    Gastrointestinal:  Positive for abdominal pain and diarrhea.  Musculoskeletal: Negative.   Skin: Negative.   Psychiatric/Behavioral: Negative.       Physical Exam Triage Vital Signs ED Triage Vitals  Encounter Vitals Group     BP 07/08/23 1417 (!) 142/86     Systolic BP Percentile --      Diastolic BP Percentile --       Pulse Rate 07/08/23 1417 73     Resp 07/08/23 1417 18     Temp 07/08/23 1417 98.4 F (36.9 C)     Temp Source 07/08/23 1417 Oral     SpO2 07/08/23 1417 97 %     Weight --      Height --      Head Circumference --      Peak Flow --      Pain Score 07/08/23 1416 0     Pain Loc --      Pain Education --      Exclude from Growth Chart --    No data found.  Updated Vital Signs BP (!) 142/86 (BP Location: Right Arm)   Pulse 73   Temp 98.4 F (36.9 C) (Oral)   Resp 18   SpO2 97%   Visual Acuity Right Eye Distance:   Left Eye Distance:   Bilateral Distance:    Right Eye Near:   Left Eye Near:    Bilateral Near:     Physical Exam Constitutional:      General: He is not in acute distress.    Appearance: He is  well-developed and normal weight. He is not ill-appearing.  Cardiovascular:     Rate and Rhythm: Normal rate and regular rhythm.  Pulmonary:     Effort: Pulmonary effort is normal.     Breath sounds: Normal breath sounds.  Abdominal:     General: Bowel sounds are increased.     Palpations: Abdomen is soft.     Tenderness: There is no abdominal tenderness. There is no guarding or rebound.  Skin:    General: Skin is warm.  Neurological:     General: No focal deficit present.     Mental Status: He is alert.  Psychiatric:        Mood and Affect: Mood normal.      UC Treatments / Results  Labs (all labs ordered are listed, but only abnormal results are displayed) Labs Reviewed - No data to display  EKG   Radiology No results found.  Procedures Procedures (including critical care time)  Medications Ordered in UC Medications - No data to display  Initial Impression / Assessment and Plan / UC Course  I have reviewed the triage vital signs and the nursing notes.  Pertinent labs & imaging results that were available during my care of the patient were reviewed by me and considered in my medical decision making (see chart for details).   Final Clinical  Impressions(s) / UC Diagnoses   Final diagnoses:  Diarrhea, unspecified type  Abdominal pain, unspecified abdominal location     Discharge Instructions      You were seen today for diarrhea and abdominal pain.  This was likely something you ate, and it will likely resolve in the next 24 hrs.  I recommend you eat bland food, and drink clear liquids as tolerated to avoid dehydration.  If you continue with symptoms into tomorrow, you may trial over the counter Imodium to help with the diarrhea.  Please return or go to the ER if you have worsening abdominal pain with fevers and chills.     ED Prescriptions   None    PDMP not reviewed this encounter.   Jannifer Franklin, MD 07/08/23 [phone]

## 2023-07-08 NOTE — ED Triage Notes (Signed)
 Patient presents to [facility] for abdominal pain, diarrhea since 0600. Not taking any OTC meds.

## 2023-11-30 ENCOUNTER — Ambulatory Visit
Admission: EM | Admit: 2023-11-30 | Discharge: 2023-11-30 | Disposition: A | Discharge status: Home / Self Care | Payer: Self-pay | Attending: Family Medicine | Admitting: Family Medicine

## 2023-11-30 DIAGNOSIS — R112 Nausea with vomiting, unspecified: Secondary | ICD-10-CM

## 2023-11-30 DIAGNOSIS — B349 Viral infection, unspecified: Secondary | ICD-10-CM

## 2023-11-30 MED ORDER — ONDANSETRON 4 MG PO TBDP: 4.0000 mg | ORAL_TABLET | Freq: Three times a day (TID) | ORAL | 0 refills | Status: AC | PRN

## 2023-11-30 NOTE — Discharge Instructions (Signed)
 You may take Zofran  every 8 hours as needed for nausea or vomiting.  Focus on hydration/electrolyte replacement with Gatorade, Powerade, Pedialyte, water.  Lots of rest.  You may use over-the-counter cough medicine as needed.  Bland diet such as bananas, rice, applesauce, toast and advance as you tolerate.  Please follow-up with your PCP if your symptoms do not continue to improve.  Please go to the ER if you develop any worsening symptoms.  Hope you feel better soon!

## 2023-11-30 NOTE — ED Provider Notes (Signed)
 [facility]    CSN: [id number] Arrival date & time: 11/30/23  9162      History   Chief Complaint Chief Complaint  Patient presents with   Nausea    HPI Kirk Fitzgerald is a 33 y.o. male  presents for evaluation of URI symptoms for 3 days. Patient reports associated symptoms of mild cough and congestion with nonbilious nonbloody vomiting. Denies fevers, diarrhea, sore throat, ear pain, body aches, shortness of breath, abdominal pain. Patient does note have a hx of asthma. Patient is not an active smoker.   Reports no known sick contacts.  Pt has taken nothing OTC for symptoms.  States his symptoms have improved.  He has not vomited for the past 12 hours.  He is able to keep fluids down and small bites of food.  No history of GI diagnoses such as Crohn's, IBS, colitis.  No recent travel.  No abdominal surgeries.  He is requesting a note to return to work.  Pt has no other concerns at this time.   HPI  History reviewed. No pertinent past medical history.  There are no active problems to display for this patient.   Past Surgical History:  Procedure Laterality Date   FOOT SURGERY Right        Home Medications    Prior to Admission medications   Medication Sig Start Date End Date Taking? Authorizing Provider  ondansetron  (ZOFRAN -ODT) 4 MG disintegrating tablet Take 1 tablet (4 mg total) by mouth every 8 (eight) hours as needed for nausea or vomiting. 11/30/23  Yes Loreda Myla SAUNDERS, NP    Family History Family History  Problem Relation Age of Onset   Hypertension Mother    Atrial fibrillation Mother    Heart attack Father    Hypertension Father    Prostate cancer Father     Social History Social History   Tobacco Use   Smoking status: Never   Smokeless tobacco: Never  Vaping Use   Vaping status: Never Used  Substance Use Topics   Alcohol use: Yes   Drug use: Yes    Types: Marijuana    Comment: Uses every other day     Allergies   Bee pollen and  Pollen extract   Review of Systems Review of Systems  HENT:  Positive for congestion.   Respiratory:  Positive for cough.   Gastrointestinal:  Positive for nausea and vomiting.     Physical Exam Triage Vital Signs ED Triage Vitals [11/30/23 0845]  Encounter Vitals Group     BP 138/83     Girls Systolic BP Percentile      Girls Diastolic BP Percentile      Boys Systolic BP Percentile      Boys Diastolic BP Percentile      Pulse Rate 70     Resp 18     Temp 98.8 F (37.1 C)     Temp Source Oral     SpO2 94 %     Weight      Height      Head Circumference      Peak Flow      Pain Score 0     Pain Loc      Pain Education      Exclude from Growth Chart    No data found.  Updated Vital Signs BP 138/83 (BP Location: Left Arm)   Pulse 70   Temp 98.8 F (37.1 C) (Oral)   Resp 18  SpO2 94%   Visual Acuity Right Eye Distance:   Left Eye Distance:   Bilateral Distance:    Right Eye Near:   Left Eye Near:    Bilateral Near:     Physical Exam Vitals and nursing note reviewed.  Constitutional:      General: He is not in acute distress.    Appearance: Normal appearance. He is not ill-appearing or toxic-appearing.  HENT:     Head: Normocephalic and atraumatic.     Right Ear: Tympanic membrane and ear canal normal.     Left Ear: Tympanic membrane and ear canal normal.     Nose: Congestion present.     Mouth/Throat:     Mouth: Mucous membranes are moist.     Pharynx: No posterior oropharyngeal erythema.  Eyes:     Pupils: Pupils are equal, round, and reactive to light.  Cardiovascular:     Rate and Rhythm: Normal rate and regular rhythm.     Heart sounds: Normal heart sounds.  Pulmonary:     Effort: Pulmonary effort is normal.     Breath sounds: Normal breath sounds. No wheezing, rhonchi or rales.  Abdominal:     General: Bowel sounds are normal. There is no distension.     Palpations: Abdomen is soft.     Tenderness: There is no abdominal tenderness. There  is no guarding or rebound.  Musculoskeletal:     Cervical back: Normal range of motion and neck supple.  Lymphadenopathy:     Cervical: No cervical adenopathy.  Skin:    General: Skin is warm and dry.  Neurological:     General: No focal deficit present.     Mental Status: He is alert and oriented to person, place, and time.  Psychiatric:        Mood and Affect: Mood normal.        Behavior: Behavior normal.      UC Treatments / Results  Labs (all labs ordered are listed, but only abnormal results are displayed) Labs Reviewed - No data to display  EKG   Radiology No results found.  Procedures Procedures (including critical care time)  Medications Ordered in UC Medications - No data to display  Initial Impression / Assessment and Plan / UC Course  I have reviewed the triage vital signs and the nursing notes.  Pertinent labs & imaging results that were available during my care of the patient were reviewed by me and considered in my medical decision making (see chart for details).     Reviewed exam and symptoms with patient.  No red flags.  Patient presenting with improving cough, congestion with nausea and vomiting.  Able to stay hydrated.  He is afebrile in clinic and well-appearing.  He is requesting a work note which was provided.  Did send Rx Zofran  to have on hand if symptoms return.  Discussed rest/electrolyte replacement/hydration as well as brat diet.  He declined COVID testing.  He can use over-the-counter cough medicine as needed.  Advised PCP follow-up if symptoms do not improve.  ER precautions reviewed. Final Clinical Impressions(s) / UC Diagnoses   Final diagnoses:  Viral illness  Nausea and vomiting, unspecified vomiting type     Discharge Instructions      You may take Zofran  every 8 hours as needed for nausea or vomiting.  Focus on hydration/electrolyte replacement with Gatorade, Powerade, Pedialyte, water.  Lots of rest.  You may use  over-the-counter cough medicine as needed.  Bland diet such  as bananas, rice, applesauce, toast and advance as you tolerate.  Please follow-up with your PCP if your symptoms do not continue to improve.  Please go to the ER if you develop any worsening symptoms.  Hope you feel better soon!    ED Prescriptions     Medication Sig Dispense Auth. Provider   ondansetron  (ZOFRAN -ODT) 4 MG disintegrating tablet Take 1 tablet (4 mg total) by mouth every 8 (eight) hours as needed for nausea or vomiting. 8 tablet Encarnacion Scioneaux, Jodi R, NP      PDMP not reviewed this encounter.   Loreda Myla SAUNDERS, NP 11/30/23 [phone]

## 2023-11-30 NOTE — ED Triage Notes (Signed)
 Pt present with vomiting and nausea x two days. States he feels better today and was able to sleep last night. Pt states he wants a note for work.
